# Patient Record
Sex: Female | Born: 1985 | Race: White | Hispanic: No | Smoking: Current every day smoker
Health system: Southern US, Community
[De-identification: ages and names within clinical notes are randomized; demographics above are authoritative.]

## PROBLEM LIST (undated history)

## (undated) DIAGNOSIS — F419 Anxiety disorder, unspecified: Secondary | ICD-10-CM

## (undated) DIAGNOSIS — F99 Mental disorder, not otherwise specified: Secondary | ICD-10-CM

## (undated) DIAGNOSIS — F32A Depression, unspecified: Secondary | ICD-10-CM

## (undated) DIAGNOSIS — F329 Major depressive disorder, single episode, unspecified: Secondary | ICD-10-CM

## (undated) DIAGNOSIS — K759 Inflammatory liver disease, unspecified: Secondary | ICD-10-CM

## (undated) DIAGNOSIS — Z8619 Personal history of other infectious and parasitic diseases: Secondary | ICD-10-CM

## (undated) HISTORY — PX: WISDOM TOOTH EXTRACTION: SHX21

## (undated) HISTORY — DX: Inflammatory liver disease, unspecified: K75.9

## (undated) HISTORY — DX: Personal history of other infectious and parasitic diseases: Z86.19

---
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
## 2007-02-01 ENCOUNTER — Emergency Department (HOSPITAL_COMMUNITY): Admission: EM | Admit: 2007-02-01 | Discharge: 2007-02-01 | Payer: Self-pay | Admitting: Family Medicine

## 2008-05-02 ENCOUNTER — Emergency Department (HOSPITAL_COMMUNITY): Admission: EM | Admit: 2008-05-02 | Discharge: 2008-05-02 | Payer: Self-pay | Admitting: Emergency Medicine

## 2008-10-04 ENCOUNTER — Inpatient Hospital Stay (HOSPITAL_COMMUNITY): Admission: AD | Admit: 2008-10-04 | Discharge: 2008-10-04 | Payer: Self-pay | Admitting: Obstetrics & Gynecology

## 2008-10-23 ENCOUNTER — Encounter: Payer: Self-pay | Admitting: Obstetrics and Gynecology

## 2008-10-23 ENCOUNTER — Ambulatory Visit: Payer: Self-pay | Admitting: Obstetrics & Gynecology

## 2008-10-23 LAB — CONVERTED CEMR LAB
Eosinophils Absolute: 0.3 10*3/uL (ref 0.0–0.7)
HCT: 33.6 % — ABNORMAL LOW (ref 36.0–46.0)
Lymphs Abs: 2.8 10*3/uL (ref 0.7–4.0)
MCV: 100 fL (ref 78.0–100.0)
Monocytes Relative: 10 % (ref 3–12)
Neutrophils Relative %: 75 % (ref 43–77)
RBC: 3.36 M/uL — ABNORMAL LOW (ref 3.87–5.11)
Rubella: 140.6 intl units/mL — ABNORMAL HIGH
WBC: 21.2 10*3/uL — ABNORMAL HIGH (ref 4.0–10.5)

## 2008-10-31 ENCOUNTER — Ambulatory Visit (HOSPITAL_COMMUNITY): Admission: RE | Admit: 2008-10-31 | Discharge: 2008-10-31 | Payer: Self-pay | Admitting: Family Medicine

## 2008-10-31 ENCOUNTER — Ambulatory Visit: Payer: Self-pay | Admitting: Family Medicine

## 2008-11-04 ENCOUNTER — Ambulatory Visit: Payer: Self-pay | Admitting: Obstetrics & Gynecology

## 2008-11-07 ENCOUNTER — Encounter: Payer: Self-pay | Admitting: Obstetrics and Gynecology

## 2008-11-07 ENCOUNTER — Ambulatory Visit: Payer: Self-pay | Admitting: Family Medicine

## 2008-11-07 LAB — CONVERTED CEMR LAB
ALT: 100 units/L — ABNORMAL HIGH (ref 0–35)
AST: 150 units/L — ABNORMAL HIGH (ref 0–37)
Alkaline Phosphatase: 200 units/L — ABNORMAL HIGH (ref 39–117)
Bilirubin, Direct: 0.1 mg/dL (ref 0.0–0.3)
Indirect Bilirubin: 0.1 mg/dL (ref 0.0–0.9)

## 2008-11-08 ENCOUNTER — Encounter: Payer: Self-pay | Admitting: Obstetrics and Gynecology

## 2008-11-11 ENCOUNTER — Ambulatory Visit: Payer: Self-pay | Admitting: Obstetrics & Gynecology

## 2008-11-14 ENCOUNTER — Ambulatory Visit: Payer: Self-pay | Admitting: Family Medicine

## 2008-11-18 ENCOUNTER — Ambulatory Visit: Payer: Self-pay | Admitting: Obstetrics & Gynecology

## 2008-11-21 ENCOUNTER — Encounter: Payer: Self-pay | Admitting: Obstetrics and Gynecology

## 2008-11-21 ENCOUNTER — Ambulatory Visit: Payer: Self-pay | Admitting: Family Medicine

## 2008-11-21 LAB — CONVERTED CEMR LAB
ALT: 96 units/L — ABNORMAL HIGH (ref 0–35)
Alkaline Phosphatase: 234 units/L — ABNORMAL HIGH (ref 39–117)
MCHC: 33.6 g/dL (ref 30.0–36.0)
MCV: 97.9 fL (ref 78.0–100.0)
Platelets: 388 10*3/uL (ref 150–400)
RDW: 15.8 % — ABNORMAL HIGH (ref 11.5–15.5)
Sodium: 137 meq/L (ref 135–145)
Total Bilirubin: 0.3 mg/dL (ref 0.3–1.2)
Total Protein: 6.6 g/dL (ref 6.0–8.3)

## 2008-11-22 ENCOUNTER — Inpatient Hospital Stay (HOSPITAL_COMMUNITY): Admission: AD | Admit: 2008-11-22 | Discharge: 2008-11-24 | Payer: Self-pay | Admitting: Obstetrics & Gynecology

## 2008-11-22 ENCOUNTER — Ambulatory Visit: Payer: Self-pay | Admitting: Family Medicine

## 2008-12-04 ENCOUNTER — Ambulatory Visit: Payer: Self-pay | Admitting: Advanced Practice Midwife

## 2008-12-04 ENCOUNTER — Inpatient Hospital Stay (HOSPITAL_COMMUNITY): Admission: AD | Admit: 2008-12-04 | Discharge: 2008-12-04 | Payer: Self-pay | Admitting: Obstetrics and Gynecology

## 2009-06-19 ENCOUNTER — Emergency Department (HOSPITAL_COMMUNITY): Admission: EM | Admit: 2009-06-19 | Discharge: 2009-06-19 | Payer: Self-pay | Admitting: Emergency Medicine

## 2009-09-16 ENCOUNTER — Ambulatory Visit (HOSPITAL_COMMUNITY): Admission: RE | Admit: 2009-09-16 | Discharge: 2009-09-16 | Payer: Self-pay | Admitting: Obstetrics

## 2009-11-30 ENCOUNTER — Inpatient Hospital Stay (HOSPITAL_COMMUNITY): Admission: AD | Admit: 2009-11-30 | Discharge: 2009-12-02 | Payer: Self-pay | Admitting: Obstetrics

## 2009-12-03 ENCOUNTER — Inpatient Hospital Stay (HOSPITAL_COMMUNITY): Admission: AD | Admit: 2009-12-03 | Discharge: 2009-12-05 | Payer: Self-pay | Admitting: Obstetrics

## 2010-09-02 ENCOUNTER — Emergency Department (HOSPITAL_COMMUNITY)
Admission: EM | Admit: 2010-09-02 | Discharge: 2010-09-02 | Disposition: A | Discharge status: Home / Self Care | Payer: Self-pay | Attending: Emergency Medicine | Admitting: Emergency Medicine

## 2010-09-02 DIAGNOSIS — N898 Other specified noninflammatory disorders of vagina: Secondary | ICD-10-CM | POA: Insufficient documentation

## 2010-09-02 DIAGNOSIS — R1032 Left lower quadrant pain: Secondary | ICD-10-CM | POA: Insufficient documentation

## 2010-09-02 DIAGNOSIS — R110 Nausea: Secondary | ICD-10-CM | POA: Insufficient documentation

## 2010-09-02 DIAGNOSIS — B3731 Acute candidiasis of vulva and vagina: Secondary | ICD-10-CM | POA: Insufficient documentation

## 2010-09-02 DIAGNOSIS — B373 Candidiasis of vulva and vagina: Secondary | ICD-10-CM | POA: Insufficient documentation

## 2010-09-02 DIAGNOSIS — K5900 Constipation, unspecified: Secondary | ICD-10-CM | POA: Insufficient documentation

## 2010-09-02 LAB — URINALYSIS, ROUTINE W REFLEX MICROSCOPIC
Bilirubin Urine: NEGATIVE
Ketones, ur: NEGATIVE mg/dL
Specific Gravity, Urine: 1.011 (ref 1.005–1.030)
Urine Glucose, Fasting: NEGATIVE mg/dL
pH: 6.5 (ref 5.0–8.0)

## 2010-09-02 LAB — WET PREP, GENITAL
Clue Cells Wet Prep HPF POC: NONE SEEN
Trich, Wet Prep: NONE SEEN

## 2010-09-03 LAB — GC/CHLAMYDIA PROBE AMP, GENITAL: Chlamydia, DNA Probe: NEGATIVE

## 2010-09-21 LAB — DIFFERENTIAL
Basophils Absolute: 0.1 10*3/uL (ref 0.0–0.1)
Basophils Relative: 0 % (ref 0–1)
Lymphocytes Relative: 10 % — ABNORMAL LOW (ref 12–46)
Monocytes Absolute: 1.2 10*3/uL — ABNORMAL HIGH (ref 0.1–1.0)
Neutro Abs: 13.3 10*3/uL — ABNORMAL HIGH (ref 1.7–7.7)
Neutrophils Relative %: 79 % — ABNORMAL HIGH (ref 43–77)

## 2010-09-21 LAB — RAPID URINE DRUG SCREEN, HOSP PERFORMED
Amphetamines: NOT DETECTED
Barbiturates: NOT DETECTED
Cocaine: POSITIVE — AB
Opiates: NOT DETECTED
Tetrahydrocannabinol: NOT DETECTED

## 2010-09-21 LAB — CBC
HCT: 30.4 % — ABNORMAL LOW (ref 36.0–46.0)
Hemoglobin: 9 g/dL — ABNORMAL LOW (ref 12.0–15.0)
Hemoglobin: 10.5 g/dL — ABNORMAL LOW (ref 12.0–15.0)
Hemoglobin: 9.4 g/dL — ABNORMAL LOW (ref 12.0–15.0)
MCHC: 34.2 g/dL (ref 30.0–36.0)
MCHC: 35.1 g/dL (ref 30.0–36.0)
MCV: 88.9 fL (ref 78.0–100.0)
Platelets: 295 10*3/uL (ref 150–400)
Platelets: 298 10*3/uL (ref 150–400)
Platelets: 326 10*3/uL (ref 150–400)
RBC: 3.46 MIL/uL — ABNORMAL LOW (ref 3.87–5.11)
RDW: 16.1 % — ABNORMAL HIGH (ref 11.5–15.5)
RDW: 16.3 % — ABNORMAL HIGH (ref 11.5–15.5)
RDW: 16 % — ABNORMAL HIGH (ref 11.5–15.5)
WBC: 28.1 10*3/uL — ABNORMAL HIGH (ref 4.0–10.5)
WBC: 15.5 10*3/uL — ABNORMAL HIGH (ref 4.0–10.5)

## 2010-09-21 LAB — URINALYSIS, ROUTINE W REFLEX MICROSCOPIC
Ketones, ur: NEGATIVE mg/dL
Nitrite: NEGATIVE
Urobilinogen, UA: 0.2 mg/dL (ref 0.0–1.0)
pH: 7 (ref 5.0–8.0)

## 2010-10-05 LAB — URINALYSIS, ROUTINE W REFLEX MICROSCOPIC
Bilirubin Urine: NEGATIVE
Hgb urine dipstick: NEGATIVE
Nitrite: NEGATIVE
Protein, ur: NEGATIVE mg/dL
Specific Gravity, Urine: 1.028 (ref 1.005–1.030)
Urobilinogen, UA: 0.2 mg/dL (ref 0.0–1.0)

## 2010-10-05 LAB — URINE MICROSCOPIC-ADD ON

## 2010-10-05 LAB — POCT PREGNANCY, URINE: Preg Test, Ur: POSITIVE

## 2010-10-13 LAB — CBC
HCT: 37.2 % (ref 36.0–46.0)
Hemoglobin: 13.1 g/dL (ref 12.0–15.0)
MCHC: 35.6 g/dL (ref 30.0–36.0)
MCV: 97 fL (ref 78.0–100.0)
Platelets: 237 10*3/uL (ref 150–400)
RBC: 3.83 MIL/uL — ABNORMAL LOW (ref 3.87–5.11)
RDW: 15.5 % (ref 11.5–15.5)

## 2010-10-13 LAB — POCT URINALYSIS DIP (DEVICE)
Bilirubin Urine: NEGATIVE
Bilirubin Urine: NEGATIVE
Glucose, UA: NEGATIVE mg/dL
Glucose, UA: NEGATIVE mg/dL
Hgb urine dipstick: NEGATIVE
Ketones, ur: NEGATIVE mg/dL
Nitrite: NEGATIVE
Nitrite: NEGATIVE
Nitrite: NEGATIVE
Urobilinogen, UA: 0.2 mg/dL (ref 0.0–1.0)
pH: 7 (ref 5.0–8.0)

## 2010-10-13 LAB — RAPID URINE DRUG SCREEN, HOSP PERFORMED
Amphetamines: NOT DETECTED
Barbiturates: NOT DETECTED
Benzodiazepines: NOT DETECTED
Cocaine: POSITIVE — AB
Opiates: NOT DETECTED

## 2010-10-13 LAB — GLUCOSE, RANDOM: Glucose, Bld: 82 mg/dL (ref 70–99)

## 2010-10-14 LAB — COMPREHENSIVE METABOLIC PANEL
ALT: 85 U/L — ABNORMAL HIGH (ref 0–35)
AST: 121 U/L — ABNORMAL HIGH (ref 0–37)
Albumin: 2.5 g/dL — ABNORMAL LOW (ref 3.5–5.2)
CO2: 24 mEq/L (ref 19–32)
Calcium: 8.8 mg/dL (ref 8.4–10.5)
GFR calc Af Amer: >60 mL/min (ref >60)
Sodium: 137 mEq/L (ref 135–145)
Total Protein: 5.4 g/dL — ABNORMAL LOW (ref 6.0–8.3)

## 2010-10-14 LAB — RAPID URINE DRUG SCREEN, HOSP PERFORMED
Amphetamines: NOT DETECTED
Cocaine: POSITIVE — AB
Tetrahydrocannabinol: POSITIVE — AB

## 2010-10-14 LAB — STREP B DNA PROBE

## 2010-10-14 LAB — GC/CHLAMYDIA PROBE AMP, URINE: GC Probe Amp, Urine: POSITIVE — AB

## 2010-10-14 LAB — POCT URINALYSIS DIP (DEVICE)
Bilirubin Urine: NEGATIVE
Hgb urine dipstick: NEGATIVE
Hgb urine dipstick: NEGATIVE
Nitrite: NEGATIVE
Protein, ur: NEGATIVE mg/dL
Specific Gravity, Urine: 1.015 (ref 1.005–1.030)
Urobilinogen, UA: 0.2 mg/dL (ref 0.0–1.0)
pH: 6.5 (ref 5.0–8.0)

## 2010-10-14 LAB — URINE MICROSCOPIC-ADD ON

## 2010-10-14 LAB — URINALYSIS, ROUTINE W REFLEX MICROSCOPIC
Bilirubin Urine: NEGATIVE
Glucose, UA: NEGATIVE mg/dL
Hgb urine dipstick: NEGATIVE
Ketones, ur: NEGATIVE mg/dL
pH: 6 (ref 5.0–8.0)

## 2010-10-14 LAB — CBC
MCHC: 35 g/dL (ref 30.0–36.0)
MCV: 98.8 fL (ref 78.0–100.0)
Platelets: 358 10*3/uL (ref 150–400)
RDW: 14.6 % (ref 11.5–15.5)
WBC: 16.1 10*3/uL — ABNORMAL HIGH (ref 4.0–10.5)

## 2010-10-14 LAB — PROTIME-INR: INR: 0.9 (ref 0.00–1.49)
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
## 2011-04-06 LAB — URINALYSIS, ROUTINE W REFLEX MICROSCOPIC
Bilirubin Urine: NEGATIVE
Glucose, UA: NEGATIVE
Hgb urine dipstick: NEGATIVE
Ketones, ur: >80 — AB
Nitrite: NEGATIVE
Protein, ur: NEGATIVE
Specific Gravity, Urine: 1.029
Urobilinogen, UA: 0.2
pH: 6

## 2011-04-06 LAB — URINE MICROSCOPIC-ADD ON

## 2011-04-06 LAB — POCT PREGNANCY, URINE: Preg Test, Ur: POSITIVE

## 2012-04-04 ENCOUNTER — Inpatient Hospital Stay (HOSPITAL_COMMUNITY)
Admission: AD | Admit: 2012-04-04 | Discharge: 2012-04-11 | DRG: 897 | Disposition: A | Discharge status: Home / Self Care | Payer: No Typology Code available for payment source | Source: Ambulatory Visit | Attending: Psychiatry | Admitting: Psychiatry

## 2012-04-04 ENCOUNTER — Encounter (HOSPITAL_COMMUNITY): Payer: Self-pay | Admitting: Emergency Medicine

## 2012-04-04 ENCOUNTER — Encounter (HOSPITAL_COMMUNITY): Payer: Self-pay | Admitting: *Deleted

## 2012-04-04 ENCOUNTER — Emergency Department (HOSPITAL_COMMUNITY)
Admission: EM | Admit: 2012-04-04 | Discharge: 2012-04-04 | Disposition: A | Discharge status: Other Acute Inpatient Hospital | Payer: Medicaid Other | Attending: Emergency Medicine | Admitting: Emergency Medicine

## 2012-04-04 DIAGNOSIS — F329 Major depressive disorder, single episode, unspecified: Secondary | ICD-10-CM

## 2012-04-04 DIAGNOSIS — F19939 Other psychoactive substance use, unspecified with withdrawal, unspecified: Principal | ICD-10-CM | POA: Diagnosis not present

## 2012-04-04 DIAGNOSIS — Z23 Encounter for immunization: Secondary | ICD-10-CM

## 2012-04-04 DIAGNOSIS — F1210 Cannabis abuse, uncomplicated: Secondary | ICD-10-CM

## 2012-04-04 DIAGNOSIS — IMO0002 Reserved for concepts with insufficient information to code with codable children: Secondary | ICD-10-CM

## 2012-04-04 DIAGNOSIS — F1994 Other psychoactive substance use, unspecified with psychoactive substance-induced mood disorder: Secondary | ICD-10-CM | POA: Diagnosis present

## 2012-04-04 DIAGNOSIS — F1920 Other psychoactive substance dependence, uncomplicated: Secondary | ICD-10-CM | POA: Diagnosis present

## 2012-04-04 DIAGNOSIS — F17200 Nicotine dependence, unspecified, uncomplicated: Secondary | ICD-10-CM | POA: Diagnosis present

## 2012-04-04 DIAGNOSIS — Z79899 Other long term (current) drug therapy: Secondary | ICD-10-CM

## 2012-04-04 DIAGNOSIS — F411 Generalized anxiety disorder: Secondary | ICD-10-CM | POA: Diagnosis present

## 2012-04-04 DIAGNOSIS — F1420 Cocaine dependence, uncomplicated: Secondary | ICD-10-CM | POA: Insufficient documentation

## 2012-04-04 DIAGNOSIS — F1410 Cocaine abuse, uncomplicated: Secondary | ICD-10-CM

## 2012-04-04 DIAGNOSIS — F39 Unspecified mood [affective] disorder: Secondary | ICD-10-CM | POA: Diagnosis present

## 2012-04-04 HISTORY — DX: Major depressive disorder, single episode, unspecified: F32.9

## 2012-04-04 HISTORY — DX: Depression, unspecified: F32.A

## 2012-04-04 HISTORY — DX: Anxiety disorder, unspecified: F41.9

## 2012-04-04 HISTORY — DX: Mental disorder, not otherwise specified: F99

## 2012-04-04 LAB — CBC WITH DIFFERENTIAL/PLATELET
Basophils Absolute: 0.1 10*3/uL (ref 0.0–0.1)
Basophils Relative: 1 % (ref 0–1)
Eosinophils Absolute: 0.2 10*3/uL (ref 0.0–0.7)
Hemoglobin: 13.1 g/dL (ref 12.0–15.0)
MCH: 32.3 pg (ref 26.0–34.0)
MCHC: 33.6 g/dL (ref 30.0–36.0)
Monocytes Relative: 9 % (ref 3–12)
Neutro Abs: 5.8 10*3/uL (ref 1.7–7.7)
Neutrophils Relative %: 61 % (ref 43–77)
Platelets: 365 10*3/uL (ref 150–400)

## 2012-04-04 LAB — RAPID URINE DRUG SCREEN, HOSP PERFORMED
Amphetamines: NOT DETECTED
Barbiturates: NOT DETECTED
Benzodiazepines: POSITIVE — AB
Cocaine: POSITIVE — AB
Opiates: NOT DETECTED
Tetrahydrocannabinol: POSITIVE — AB

## 2012-04-04 LAB — COMPREHENSIVE METABOLIC PANEL
ALT: 7 U/L (ref 0–35)
AST: 14 U/L (ref 0–37)
Albumin: 4.2 g/dL (ref 3.5–5.2)
Alkaline Phosphatase: 86 U/L (ref 39–117)
Chloride: 104 mEq/L (ref 96–112)
Potassium: 3.7 mEq/L (ref 3.5–5.1)
Sodium: 140 mEq/L (ref 135–145)
Total Bilirubin: 0.3 mg/dL (ref 0.3–1.2)
Total Protein: 7.4 g/dL (ref 6.0–8.3)

## 2012-04-04 LAB — POCT PREGNANCY, URINE: Preg Test, Ur: NEGATIVE

## 2012-04-04 MED ORDER — ACETAMINOPHEN 325 MG PO TABS: 650.0000 mg | ORAL_TABLET | ORAL | Status: DC | PRN

## 2012-04-04 MED ORDER — HYDROXYZINE HCL 25 MG PO TABS
25.0000 mg | ORAL_TABLET | Freq: Four times a day (QID) | ORAL | Status: AC | PRN
  Administered 2012-04-05 – 2012-04-06 (×3): 25 mg via ORAL

## 2012-04-04 MED ORDER — TRAZODONE HCL 50 MG PO TABS
50.0000 mg | ORAL_TABLET | Freq: Every evening | ORAL | Status: DC | PRN
  Administered 2012-04-04 – 2012-04-07 (×4): 50 mg via ORAL
  Filled 2012-04-04: qty 1
  Filled 2012-04-04: qty 14
  Filled 2012-04-04 (×3): qty 1

## 2012-04-04 MED ORDER — CHLORDIAZEPOXIDE HCL 25 MG PO CAPS: 25.0000 mg | ORAL_CAPSULE | Freq: Four times a day (QID) | ORAL | Status: DC | PRN

## 2012-04-04 MED ORDER — THIAMINE HCL 100 MG/ML IJ SOLN: 100.0000 mg | Freq: Once | INTRAMUSCULAR | Status: DC

## 2012-04-04 MED ORDER — HYDROXYZINE HCL 25 MG PO TABS: 25.0000 mg | ORAL_TABLET | Freq: Four times a day (QID) | ORAL | Status: DC | PRN

## 2012-04-04 MED ORDER — NICOTINE 21 MG/24HR TD PT24
21.0000 mg | MEDICATED_PATCH | Freq: Every day | TRANSDERMAL | Status: DC
  Administered 2012-04-04: 21 mg via TRANSDERMAL
  Filled 2012-04-04: qty 1

## 2012-04-04 MED ORDER — MAGNESIUM HYDROXIDE 400 MG/5ML PO SUSP
30.0000 mL | Freq: Every day | ORAL | Status: DC | PRN
  Administered 2012-04-06 – 2012-04-09 (×2): 30 mL via ORAL

## 2012-04-04 MED ORDER — ONDANSETRON 4 MG PO TBDP: 4.0000 mg | ORAL_TABLET | Freq: Four times a day (QID) | ORAL | Status: AC | PRN

## 2012-04-04 MED ORDER — LORAZEPAM 1 MG PO TABS
1.0000 mg | ORAL_TABLET | Freq: Three times a day (TID) | ORAL | Status: DC | PRN
  Administered 2012-04-04: 1 mg via ORAL
  Filled 2012-04-04: qty 1

## 2012-04-04 MED ORDER — VITAMIN B-1 100 MG PO TABS: 100.0000 mg | ORAL_TABLET | Freq: Every day | ORAL | Status: DC

## 2012-04-04 MED ORDER — LOPERAMIDE HCL 2 MG PO CAPS: 2.0000 mg | ORAL_CAPSULE | ORAL | Status: DC | PRN

## 2012-04-04 MED ORDER — THIAMINE HCL 100 MG/ML IJ SOLN
100.0000 mg | Freq: Once | INTRAMUSCULAR | Status: AC
  Administered 2012-04-04: 100 mg via INTRAMUSCULAR

## 2012-04-04 MED ORDER — ACETAMINOPHEN 325 MG PO TABS: 650.0000 mg | ORAL_TABLET | Freq: Four times a day (QID) | ORAL | Status: DC | PRN

## 2012-04-04 MED ORDER — ADULT MULTIVITAMIN W/MINERALS CH
1.0000 | ORAL_TABLET | Freq: Every day | ORAL | Status: DC
  Administered 2012-04-04 – 2012-04-10 (×7): 1 via ORAL
  Filled 2012-04-04 (×9): qty 1

## 2012-04-04 MED ORDER — CHLORDIAZEPOXIDE HCL 25 MG PO CAPS
25.0000 mg | ORAL_CAPSULE | Freq: Three times a day (TID) | ORAL | Status: AC
  Administered 2012-04-06 – 2012-04-07 (×3): 25 mg via ORAL
  Filled 2012-04-04 (×3): qty 1

## 2012-04-04 MED ORDER — CHLORDIAZEPOXIDE HCL 25 MG PO CAPS
25.0000 mg | ORAL_CAPSULE | Freq: Four times a day (QID) | ORAL | Status: AC | PRN
  Administered 2012-04-04 – 2012-04-07 (×3): 25 mg via ORAL
  Filled 2012-04-04 (×3): qty 1

## 2012-04-04 MED ORDER — ONDANSETRON 4 MG PO TBDP: 4.0000 mg | ORAL_TABLET | Freq: Four times a day (QID) | ORAL | Status: DC | PRN

## 2012-04-04 MED ORDER — CHLORDIAZEPOXIDE HCL 25 MG PO CAPS
25.0000 mg | ORAL_CAPSULE | Freq: Four times a day (QID) | ORAL | Status: AC
  Administered 2012-04-04 – 2012-04-06 (×6): 25 mg via ORAL
  Filled 2012-04-04 (×6): qty 1

## 2012-04-04 MED ORDER — VITAMIN B-1 100 MG PO TABS
100.0000 mg | ORAL_TABLET | Freq: Every day | ORAL | Status: DC
  Filled 2012-04-04: qty 1

## 2012-04-04 MED ORDER — CHLORDIAZEPOXIDE HCL 25 MG PO CAPS
25.0000 mg | ORAL_CAPSULE | ORAL | Status: AC
  Administered 2012-04-07 – 2012-04-08 (×2): 25 mg via ORAL
  Filled 2012-04-04 (×2): qty 1

## 2012-04-04 MED ORDER — LOPERAMIDE HCL 2 MG PO CAPS: 2.0000 mg | ORAL_CAPSULE | ORAL | Status: AC | PRN

## 2012-04-04 MED ORDER — CHLORDIAZEPOXIDE HCL 25 MG PO CAPS
25.0000 mg | ORAL_CAPSULE | Freq: Every day | ORAL | Status: AC
  Administered 2012-04-09: 25 mg via ORAL
  Filled 2012-04-04: qty 1

## 2012-04-04 MED ORDER — IBUPROFEN 200 MG PO TABS: 600.0000 mg | ORAL_TABLET | Freq: Three times a day (TID) | ORAL | Status: DC | PRN

## 2012-04-04 MED ORDER — ONDANSETRON HCL 8 MG PO TABS: 4.0000 mg | ORAL_TABLET | Freq: Three times a day (TID) | ORAL | Status: DC | PRN

## 2012-04-04 MED ORDER — ALUM & MAG HYDROXIDE-SIMETH 200-200-20 MG/5ML PO SUSP
30.0000 mL | ORAL | Status: DC | PRN
  Administered 2012-04-07 – 2012-04-09 (×2): 30 mL via ORAL

## 2012-04-04 MED ORDER — ADULT MULTIVITAMIN W/MINERALS CH: 1.0000 | ORAL_TABLET | Freq: Every day | ORAL | Status: DC

## 2012-04-04 NOTE — ED Notes (Addendum)
Pt sent to ED by detox facility for medical clearance. Pt reports she was to check into treatment facility for detox from crack/cocaine but because she takes Klonopin she was sent to ED. Last use of crack/cocaine was 0200 today.
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
## 2012-04-04 NOTE — ED Provider Notes (Signed)
History     CSN: [id number]  Arrival date & time 04/04/12  4098   First MD Initiated Contact with Patient 04/04/12 [phone]      Chief Complaint  Patient presents with  . Medical Clearance    (Consider location/radiation/quality/duration/timing/severity/associated sxs/prior treatment) The history is provided by the patient.   Patient here requesting detox from cocaine. Denies any suicidal or homicidal ideations. Went to a treatment center and was told to come here for placement. Denies any auditory or visual hallucinations. Last used cocaine yesterday. Hasn't taken her Klonopin as prescribed History reviewed. No pertinent past medical history.  History reviewed. No pertinent past surgical history.  No family history on file.  History  Substance Use Topics  . Smoking status: Not on file  . Smokeless tobacco: Not on file  . Alcohol Use: Not on file    OB History    Grav Para Term Preterm Abortions TAB SAB Ect Mult Living                  Review of Systems  All other systems reviewed and are negative.    Allergies  Keflet  Home Medications   Current Outpatient Rx  Name Route Sig Dispense Refill  . CLONAZEPAM 1 MG PO TABS Oral Take 1 mg by mouth 2 (two) times daily.    . TRAZODONE HCL 50 MG PO TABS Oral Take 50-100 mg by mouth at bedtime as needed. For sleep      BP 96/67  Pulse 92  Temp 97.3 F (36.3 C) (Oral)  Resp 16  Ht 5\' 4"  (1.626 m)  Wt 130 lb (58.968 kg)  BMI 22.31 kg/m2  SpO2 97%  Physical Exam  Nursing note and vitals reviewed. Constitutional: She is oriented to person, place, and time. She appears well-developed and well-nourished.  Non-toxic appearance. No distress.  HENT:  Head: Normocephalic and atraumatic.  Eyes: Conjunctivae normal, EOM and lids are normal. Pupils are equal, round, and reactive to light.  Neck: Normal range of motion. Neck supple. No tracheal deviation present. No mass present.  Cardiovascular: Normal rate, regular rhythm  and normal heart sounds.  Exam reveals no gallop.   No murmur heard. Pulmonary/Chest: Effort normal and breath sounds normal. No stridor. No respiratory distress. She has no decreased breath sounds. She has no wheezes. She has no rhonchi. She has no rales.  Abdominal: Soft. Normal appearance and bowel sounds are normal. She exhibits no distension. There is no tenderness. There is no rebound and no CVA tenderness.  Musculoskeletal: Normal range of motion. She exhibits no edema and no tenderness.  Neurological: She is alert and oriented to person, place, and time. She has normal strength. No cranial nerve deficit or sensory deficit. GCS eye subscore is 4. GCS verbal subscore is 5. GCS motor subscore is 6.  Skin: Skin is warm and dry. No abrasion and no rash noted.  Psychiatric: Her speech is normal and behavior is normal. Her affect is blunt.    ED Course  Procedures (including critical care time)   Labs Reviewed  ETHANOL  URINE RAPID DRUG SCREEN (HOSP PERFORMED)  CBC WITH DIFFERENTIAL  COMPREHENSIVE METABOLIC PANEL   No results found.   No diagnosis found.    MDM  Pt medically cleared and act to dispo        Toy Baker, MD 04/07/12 [phone]

## 2012-04-04 NOTE — BH Assessment (Signed)
Assessment Note  Update:  Received call from [facility] stating pt accepted to bed 305-2 by Dr. Allena Katz to Dr. Daleen Bo and that pt could be transported.  Updated EDP Bonk and ED staff.  Completed support paperwork, updated assessment disposition, completed assessment notification and faxed to [facility] to log.     Disposition:  Disposition Disposition of Patient: Inpatient treatment program Type of inpatient treatment program: Adult Patient referred to: Other (Comment) (Pt accepted [facility])  On Site Evaluation by:  Freida Busman Reviewed with Physician:  Earley Brooke, Rennis Harding 04/04/2012 2:07 PM
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
## 2012-04-04 NOTE — Tx Team (Signed)
Initial Interdisciplinary Treatment Plan  PATIENT STRENGTHS: (choose at least two) Communication skills General fund of knowledge Motivation for treatment/growth  PATIENT STRESSORS: Financial difficulties Health problems Medication change or noncompliance Occupational concerns Substance abuse   PROBLEM LIST: Problem List/Patient Goals Date to be addressed Date deferred Reason deferred Estimated date of resolution  Substance abuse 04-04-2012   D/c        depression 04-04-2012   D/c        anxiety 04-04-2012   D/c                           DISCHARGE CRITERIA:  Ability to meet basic life and health needs Adequate post-discharge living arrangements Improved stabilization in mood, thinking, and/or behavior Medical problems require only outpatient monitoring Motivation to continue treatment in a less acute level of care Need for constant or close observation no longer present Reduction of life-threatening or endangering symptoms to within safe limits Safe-care adequate arrangements made Verbal commitment to aftercare and medication compliance Withdrawal symptoms are absent or subacute and managed without 24-hour nursing intervention  PRELIMINARY DISCHARGE PLAN: Attend aftercare/continuing care group Attend PHP/IOP Attend 12-step recovery group Outpatient therapy Participate in family therapy Placement in alternative living arrangements  PATIENT/FAMIILY INVOLVEMENT: This treatment plan has been presented to and reviewed with the patient, Karen Salas.  The patient and family have been given the opportunity to ask questions and make suggestions.  [name] 04/04/2012, 5:31 PM

## 2012-04-04 NOTE — ED Notes (Signed)
[facility] paged to transport patient to [facility].

## 2012-04-04 NOTE — ED Notes (Signed)
Pt given scrubs and socks to pt on; pt changing out of street clothes; security has been paged to wand pt

## 2012-04-04 NOTE — ED Notes (Signed)
Patient wanded by Security and dressed in blue scrubs. Patient given snacks and drink. Lunch tray ordered.

## 2012-04-04 NOTE — ED Notes (Signed)
Assisted Pat, RN with inventory of pt's belongings, security locked up pt's valuables

## 2012-04-04 NOTE — ED Notes (Addendum)
Patient states last used crack at 0200 this date and denies other drug use. Patient states she has tried outpatient treatment and it did not work for her. Patient states she is feeling shaky and hungry. Patient states she has a 24 year old daughter and husband at home.

## 2012-04-05 MED ORDER — NICOTINE 21 MG/24HR TD PT24
21.0000 mg | MEDICATED_PATCH | Freq: Every day | TRANSDERMAL | Status: DC
  Administered 2012-04-05 – 2012-04-11 (×6): 21 mg via TRANSDERMAL
  Filled 2012-04-05 (×7): qty 1

## 2012-04-05 NOTE — Progress Notes (Signed)
Pt resting in bed with eyes closed.  No distress observed.  Safety maintained with q15 minute checks. 

## 2012-04-05 NOTE — Progress Notes (Signed)
Psychoeducational Group Note  Date:  04/05/2012 Time:  1100  Group Topic/Focus:  Crisis Planning:   The purpose of this group is to help patients create a crisis plan for use upon discharge or in the future, as needed.  Participation Level:  Active  Participation Quality:  Appropriate, Sharing and Supportive  Affect:  Appropriate  Cognitive:  Appropriate  Insight:  Good  Engagement in Group:  Good  Additional Comments:  none  Jcion Buddenhagen, Genia Plants 04/05/2012, 2:08 PM

## 2012-04-05 NOTE — Progress Notes (Signed)
D- Patient has been out in milieu interacting with peers and attending groups. Reports agitation and cravings r/t benzo withdrawal. Rates depression at 2 and hopelessness at 1. Karen Salas states this is her first inpatient detox. Presents fidgety but appropriate.  A- Support and encouragement given.  Explanation on what to expect from withdrawal process.  Continue current POC and evaluation of treatment goals. 15' checks cont for safety. R- Compliant with medication protocol and education given. Introduction to 12 step concepts.  Support and encouragement given.  Remains safe on the unit.

## 2012-04-05 NOTE — Progress Notes (Signed)
[facility] Group Notes:  (Counselor/Nursing/MHT/Case Management/Adjunct)   Type of Therapy:  Group Therapy at 1:15 to 2:30 PM   Participation Level:  Active  Participation Quality:  Redirectable, Sharing and easily distracted  Affect:  Labile  Cognitive:  Alert and Oriented  Insight:  Limited  Engagement in Group:  Good  Engagement in Therapy:  Limited  Modes of Intervention:  Limit-setting, Socialization and Support  Summary of Progress/Problems: The focus of this group session was to process how we deal with difficult emotions and share with others the patterns that play out when we are reacting to the emotion verses the situation.  Karen Salas shared that one of the most difficult emotions she deals with is anxiety but was unwilling to explore ways she may change that.  Karen Salas was responsive to multiple instances of redirection.  Karen Salas 04/05/2012, 7:17 PM
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
## 2012-04-05 NOTE — Progress Notes (Signed)
Pt attended NA group tonight. 

## 2012-04-05 NOTE — Progress Notes (Signed)
Pt reports she is doing better since she was started on some medication.  She still has visible tremors and is somewhat fidgety, but overall feels better.  She was able to see one of her children tonight, and that made her "happy and sad".  She says she is determined to get off of drugs for her health and for her children.  "I want to be a good mother."  Gave support/encouragement.  She denies SI/HI/AV.  Discussed hs meds with pt.  Pt requests HIV testing as she thinks she has been exposed.  Will contact MD for orders and get signed consent.  Pt voices no other needs/concerns.  Safety maintained with q15 minute checks.
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
## 2012-04-05 NOTE — Discharge Planning (Signed)
Pt was seen this morning during discharge meeting.  Pt stated that she admitted to the hospital in order to detox from "crack," she expressed that she was using up to $1000 per day on the weekends. Pt stated that she has had many prior services that have not been successful in the past.  Pt want to go to [facility] for residential treatment.  Upon discharge pt will return home to live with boyfriend and her daughter.  Pt currently has an open DSS case.  Pt rated her depression level at zero, anxiety at a ten, and she denied SI/HI/AVH.  CM observed pt unable to sit still and shaking.

## 2012-04-06 MED ORDER — QUETIAPINE FUMARATE 50 MG PO TABS
50.0000 mg | ORAL_TABLET | Freq: Two times a day (BID) | ORAL | Status: DC
  Administered 2012-04-06 – 2012-04-08 (×5): 50 mg via ORAL
  Filled 2012-04-06 (×11): qty 1

## 2012-04-06 NOTE — Progress Notes (Signed)
[facility] Group Notes:  (Counselor/Nursing/MHT/Case Management/Adjunct)  04/06/2012 4:14 PM  Type of Therapy:  Group Therapy  Participation Level:  Active  Participation Quality:  Appropriate  Affect:  Appropriate  Cognitive:  Alert  Insight:  Good  Engagement in Group:  Good  Engagement in Therapy:  Good  Modes of Intervention:  Education, Support  Summary of Progress/Problems:  The process group today was regarding the ability of each pt to create the appropriate balance within there life in order to maintain sobriety and healthy living. The pt was able to process her personal thoughts and feelings regarding what it would take for her to create balance within her life.      Karen Salas [name] 04/06/2012, 4:14 PM

## 2012-04-06 NOTE — Progress Notes (Signed)
[facility] MD Progress Note  04/06/2012 9:01 AM S: Patient reports feeling sad yesterday. Wants to go to [facility] for treatment. Patient reports high irritability all the time prior to starting to abusing cocaine.  Diagnosis:   Axis I: Cocaine Abuse, Marijuana Abuse, Mood d/o NOS Axis II: No diagnosis Axis III:  Past Medical History  Diagnosis Date  . Anxiety   . Mental disorder   . Depression    Axis IV: economic problems, occupational problems and other psychosocial or environmental problems Axis V: 51-60 moderate symptoms  ADL's:  Intact  Sleep: Fair  Appetite:  Fair    Mental Status Examination/Evaluation: Objective:  Appearance: Casual  Eye Contact::  Fair  Speech:  Clear and Coherent  Volume:  Normal  Mood:  Anxious  Affect:  Constricted  Thought Process:  Coherent  Orientation:  Full  Thought Content:  WDL  Suicidal Thoughts:  No  Homicidal Thoughts:  No  Memory:  Immediate;   Fair Recent;   Fair Remote;   Fair  Judgement:  Fair  Insight:  Fair  Psychomotor Activity:  Normal  Concentration:  Fair  Recall:  Fair  Akathisia:  No  Handed:  Right  AIMS (if indicated):     Assets:  Communication Skills Desire for Improvement  Sleep:  Number of Hours: 6    Vital Signs:Blood pressure 108/71, pulse 111, temperature 98.2 F (36.8 C), temperature source Oral, resp. rate 14, height 5\' 5"  (1.651 m), weight 57.153 kg (126 lb), last menstrual period 03/29/2012. Current Medications: Current Facility-Administered Medications  Medication Dose Route Frequency Provider Last Rate Last Dose  . acetaminophen (TYLENOL) tablet 650 mg  650 mg Oral Q6H PRN Jorje Guild, PA-C      . alum & mag hydroxide-simeth (MAALOX/MYLANTA) 200-200-20 MG/5ML suspension 30 mL  30 mL Oral Q4H PRN Jorje Guild, PA-C      . chlordiazePOXIDE (LIBRIUM) capsule 25 mg  25 mg Oral Q6H PRN Jorje Guild, PA-C   25 mg at 04/04/12 1919  . chlordiazePOXIDE (LIBRIUM) capsule 25 mg  25 mg Oral QID Jorje Guild, PA-C   25 mg  at 04/06/12 1610   Followed by  . chlordiazePOXIDE (LIBRIUM) capsule 25 mg  25 mg Oral TID Jorje Guild, PA-C       Followed by  . chlordiazePOXIDE (LIBRIUM) capsule 25 mg  25 mg Oral BH-qamhs Jorje Guild, PA-C       Followed by  . chlordiazePOXIDE (LIBRIUM) capsule 25 mg  25 mg Oral Daily Jorje Guild, PA-C      . hydrOXYzine (ATARAX/VISTARIL) tablet 25 mg  25 mg Oral Q6H PRN Jorje Guild, PA-C   25 mg at 04/06/12 9604  . loperamide (IMODIUM) capsule 2-4 mg  2-4 mg Oral PRN Jorje Guild, PA-C      . magnesium hydroxide (MILK OF MAGNESIA) suspension 30 mL  30 mL Oral Daily PRN Jorje Guild, PA-C      . multivitamin with minerals tablet 1 tablet  1 tablet Oral Daily Jorje Guild, PA-C   1 tablet at 04/06/12 5409  . nicotine (NICODERM CQ - dosed in mg/24 hours) patch 21 mg  21 mg Transdermal Q0600 Mike Craze, MD   21 mg at 04/06/12 0617  . ondansetron (ZOFRAN-ODT) disintegrating tablet 4 mg  4 mg Oral Q6H PRN Jorje Guild, PA-C      . traZODone (DESYREL) tablet 50 mg  50 mg Oral QHS PRN Jorje Guild, PA-C   50 mg at 04/05/12 2136    Lab  Results:  Results for orders placed during the hospital encounter of 04/04/12 (from the past 48 hour(s))  ETHANOL     Status: Normal   Collection Time   04/04/12  9:25 AM      Component Value Range Comment   Alcohol, Ethyl (B) <11  0 - 11 mg/dL   URINE RAPID DRUG SCREEN (HOSP PERFORMED)     Status: Abnormal   Collection Time   04/04/12  9:45 AM      Component Value Range Comment   Opiates NONE DETECTED  NONE DETECTED    Cocaine POSITIVE (*) NONE DETECTED    Benzodiazepines POSITIVE (*) NONE DETECTED    Amphetamines NONE DETECTED  NONE DETECTED    Tetrahydrocannabinol POSITIVE (*) NONE DETECTED    Barbiturates NONE DETECTED  NONE DETECTED   POCT PREGNANCY, URINE     Status: Normal   Collection Time   04/04/12  9:52 AM      Component Value Range Comment   Preg Test, Ur NEGATIVE  NEGATIVE   CBC WITH DIFFERENTIAL     Status: Normal   Collection Time   04/04/12  9:53 AM       Component Value Range Comment   WBC 9.5  4.0 - 10.5 K/uL    RBC 4.06  3.87 - 5.11 MIL/uL    Hemoglobin 13.1  12.0 - 15.0 g/dL    HCT 40.9  81.1 - 91.4 %    MCV 96.1  78.0 - 100.0 fL    MCH 32.3  26.0 - 34.0 pg    MCHC 33.6  30.0 - 36.0 g/dL    RDW 78.2  95.6 - 21.3 %    Platelets 365  150 - 400 K/uL    Neutrophils Relative 61  43 - 77 %    Neutro Abs 5.8  1.7 - 7.7 K/uL    Lymphocytes Relative 28  12 - 46 %    Lymphs Abs 2.6  0.7 - 4.0 K/uL    Monocytes Relative 9  3 - 12 %    Monocytes Absolute 0.8  0.1 - 1.0 K/uL    Eosinophils Relative 2  0 - 5 %    Eosinophils Absolute 0.2  0.0 - 0.7 K/uL    Basophils Relative 1  0 - 1 %    Basophils Absolute 0.1  0.0 - 0.1 K/uL   COMPREHENSIVE METABOLIC PANEL     Status: Abnormal   Collection Time   04/04/12  9:53 AM      Component Value Range Comment   Sodium 140  135 - 145 mEq/L    Potassium 3.7  3.5 - 5.1 mEq/L    Chloride 104  96 - 112 mEq/L    CO2 23  19 - 32 mEq/L    Glucose, Bld 84  70 - 99 mg/dL    BUN 17  6 - 23 mg/dL    Creatinine, Ser 0.86  0.50 - 1.10 mg/dL    Calcium 9.7  8.4 - 57.8 mg/dL    Total Protein 7.4  6.0 - 8.3 g/dL    Albumin 4.2  3.5 - 5.2 g/dL    AST 14  0 - 37 U/L    ALT 7  0 - 35 U/L    Alkaline Phosphatase 86  39 - 117 U/L    Total Bilirubin 0.3  0.3 - 1.2 mg/dL    GFR calc non Af Amer 88 (*) >90 mL/min    GFR calc Af Amer >90  >  90 mL/min     Physical Findings: AIMS: Facial and Oral Movements Muscles of Facial Expression: None, normal Lips and Perioral Area: None, normal Jaw: None, normal Tongue: None, normal,Extremity Movements Upper (arms, wrists, hands, fingers): None, normal Lower (legs, knees, ankles, toes): None, normal, Trunk Movements Neck, shoulders, hips: None, normal, Overall Severity Severity of abnormal movements (highest score from questions above): None, normal Incapacitation due to abnormal movements: None, normal Patient's awareness of abnormal movements (rate only patient's  report): No Awareness, Dental Status Current problems with teeth and/or dentures?: No Does patient usually wear dentures?: No  CIWA:  CIWA-Ar Total: 3  COWS:  COWS Total Score: 5   Treatment Plan Summary: Daily contact with patient to assess and evaluate symptoms and progress in treatment Medication management  Plan: Continue Detox protocol. Start Seroquel 50mg  po bid. Patient made aware of metabolic side effects and other side effects. Continue to monitor.  Aruna Nestler 04/06/2012, 9:01 AM

## 2012-04-06 NOTE — Progress Notes (Addendum)
D:   Patient's self inventory sheet, patient sleeps well, has good appetite, normal energy level, improving attention span.   Rated depression #2, hopelessness #1.  Has been experiencing tremors.  Denied SI.  No physical problems at this time.   Worst pain #1.  After discharge, plans to "not get high".  No questions for staff.  No problems taking meds after discharge. A:  Medications given per MD order.  Support and encouragement given throughout day.  Support and safety checks completed as ordered. R:  Following treatment plan.   Denied SI and HI.   Denied A/V hallucinations.  Patient remains safe and receptive on unit.  Patient has been cooperative and pleasant.  Patient has attended and participated in groups today.

## 2012-04-06 NOTE — Discharge Planning (Signed)
Pt was seen this morning during discharge planning group.  Pt stated that she felt good and is no longer experiencing no withdrawal symptoms.  Pt rated her level of anxiety and depression level at a five.  Pt denies SI/HI/AVH.

## 2012-04-07 NOTE — Progress Notes (Signed)
(  D) Patient complaints of mood lability.  Reports sleeping well, appetite good, energy level normal and ability to pay attention. Rates both depression and hopelessness at 1/10. Endorses agitation, denies SI/HI. (A) Educated patient on medications, encouraged patient to review coping skills. (R) Patient cooperative. Joice Lofts RN MS EdS 04/07/2012  11:34 AM

## 2012-04-07 NOTE — Progress Notes (Signed)
Psychoeducational Group Note  Date:  04/07/2012 Time:  1030  Group Topic/Focus:  Relapse Prevention Planning:   The focus of this group is to define relapse and discuss the need for planning to combat relapse.  Participation Level:  Active  Participation Quality:  Appropriate, Attentive, Sharing and Supportive  Affect:  Appropriate and Excited  Cognitive:  Alert, Appropriate and Oriented  Insight:  Good  Engagement in Group:  Good  Additional Comments:  Pt attended and participated in group discussion about Relapse Prevention and how making a plan is effective before discharge.     Dalia Heading 04/07/2012, 7:04 PM

## 2012-04-07 NOTE — Tx Team (Signed)
Interdisciplinary Treatment Plan Update (Adult)  Date:  04/07/2012  Time Reviewed:  9:50 AM   Progress in Treatment: Attending groups: Yes Participating in groups:  Yes Taking medication as prescribed: Yes Tolerating medication:  Yes Family/Significant othe contact made:  Counselor assessing for appropriate contact Patient understands diagnosis:  Yes Discussing patient identified problems/goals with staff:  Yes Medical problems stabilized or resolved:  Yes Denies suicidal/homicidal ideation: Yes Issues/concerns per patient self-inventory:  None identified Other: N/A  New problem(s) identified: None Identified  Reason for Continuation of Hospitalization: Anxiety Depression Medication stabilization Withdrawal symptoms  Interventions implemented related to continuation of hospitalization: mood stabilization, medication monitoring and adjustment, group therapy and psycho education, safety checks q 15 mins  Additional comments: N/A  Estimated length of stay: 2 days  Discharge Plan: Pt is scheduled to go to [facility] on Monday fur further treatment.  New goal(s): N/A  Review of initial/current patient goals per problem list:    1.  Goal(s): Address substance use  Met:  No  Target date: by discharge  As evidenced by: completing detox protocol and refer to appropriate treatment  2.  Goal (s): Reduce depressive and anxiety symptoms  Met:  No  Target date: by discharge  As evidenced by: Reducing depression from a 10 to a 3 as reported by pt.     Attendees: Patient:     Family:     Physician: Patrick North, MD 04/07/2012 9:50 AM   Nursing: Barrie Folk, RN 04/07/2012 9:50 AM   Case Manager:  Reyes Ivan, LCSWA 04/07/2012  9:50 AM   Counselor:  Ronda Fairly, LCSWA 04/07/2012  9:50 AM   Other:  Alease Frame, RN 04/07/2012 9:50 AM   Other:     Other:     Other:      Scribe for Treatment Team:   Reyes Ivan 04/07/2012 9:50 AM

## 2012-04-07 NOTE — Progress Notes (Signed)
Pt attended discharge planning group and actively participated in group.  SW provided pt with today's workbook.  Pt presents with anxious mood and affect.  Pt rates depression at a 1 and anxiety at a 7 today.  Pt denies SI.  Pt states that she is anxious about CPS being called on her child and what this report means.  Pt states that she went to [facility] for treatment but was referred here for detox off of Klonopin.  Pt reports this was a prescription. Pt is scheduled to go to [facility] on 10/7.  Pt states that she lives in [place] and is able to return home there.  Pt reports having transportation home.  No further needs voiced by pt at this time.  Safety planning and suicide prevention discussed.  Pt participated in discussion and acknowledged an understanding of the information provided.       Reyes Ivan, LCSWA 04/07/2012  11:47 AM

## 2012-04-07 NOTE — Progress Notes (Signed)
[facility] MD Progress Note  04/07/2012 10:45 AM S: Patient reports continued irritability. Tolerating Seroquel well, no side effects. Detoxing from the klonopin.  Diagnosis:   Axis I: Alcohol Abuse and Anxiety Disorder NOS Axis II: No diagnosis Axis III:  Past Medical History  Diagnosis Date  . Anxiety   . Mental disorder   . Depression    Axis IV: housing problems and occupational problems Axis V: 51-60 moderate symptoms  ADL's:  Intact  Sleep: Fair  Appetite:  Fair    Mental Status Examination/Evaluation: Objective:  Appearance: Casual  Eye Contact::  Fair  Speech:  Clear and Coherent  Volume:  Normal  Mood:  Irritable  Affect:  Constricted  Thought Process:  Coherent  Orientation:  Full  Thought Content:  WDL  Suicidal Thoughts:  No  Homicidal Thoughts:  No  Memory:  Immediate;   Fair Recent;   Fair Remote;   Fair  Judgement:  Fair  Insight:  Present  Psychomotor Activity:  Normal  Concentration:  Fair  Recall:  Fair  Akathisia:  No  Handed:  Right  AIMS (if indicated):     Assets:  Desire for Improvement  Sleep:  Number of Hours: 5.5    Vital Signs:Blood pressure 85/55, pulse 103, temperature 97.4 F (36.3 C), temperature source Oral, resp. rate 16, height 5\' 5"  (1.651 m), weight 57.153 kg (126 lb), last menstrual period 03/29/2012. Current Medications: Current Facility-Administered Medications  Medication Dose Route Frequency Provider Last Rate Last Dose  . acetaminophen (TYLENOL) tablet 650 mg  650 mg Oral Q6H PRN Jorje Guild, PA-C      . alum & mag hydroxide-simeth (MAALOX/MYLANTA) 200-200-20 MG/5ML suspension 30 mL  30 mL Oral Q4H PRN Jorje Guild, PA-C      . chlordiazePOXIDE (LIBRIUM) capsule 25 mg  25 mg Oral Q6H PRN Jorje Guild, PA-C   25 mg at 04/06/12 2148  . chlordiazePOXIDE (LIBRIUM) capsule 25 mg  25 mg Oral TID Jorje Guild, PA-C   25 mg at 04/07/12 0757   Followed by  . chlordiazePOXIDE (LIBRIUM) capsule 25 mg  25 mg Oral BH-qamhs Jorje Guild, PA-C       Followed by  . chlordiazePOXIDE (LIBRIUM) capsule 25 mg  25 mg Oral Daily Jorje Guild, PA-C      . hydrOXYzine (ATARAX/VISTARIL) tablet 25 mg  25 mg Oral Q6H PRN Jorje Guild, PA-C   25 mg at 04/06/12 2242  . loperamide (IMODIUM) capsule 2-4 mg  2-4 mg Oral PRN Jorje Guild, PA-C      . magnesium hydroxide (MILK OF MAGNESIA) suspension 30 mL  30 mL Oral Daily PRN Jorje Guild, PA-C   30 mL at 04/06/12 9604  . multivitamin with minerals tablet 1 tablet  1 tablet Oral Daily Jorje Guild, PA-C   1 tablet at 04/07/12 0757  . nicotine (NICODERM CQ - dosed in mg/24 hours) patch 21 mg  21 mg Transdermal Q0600 Mike Craze, MD   21 mg at 04/07/12 0655  . ondansetron (ZOFRAN-ODT) disintegrating tablet 4 mg  4 mg Oral Q6H PRN Jorje Guild, PA-C      . QUEtiapine (SEROQUEL) tablet 50 mg  50 mg Oral BID Wendel Homeyer, MD   50 mg at 04/07/12 0758  . traZODone (DESYREL) tablet 50 mg  50 mg Oral QHS PRN Jorje Guild, PA-C   50 mg at 04/06/12 2149    Lab Results:  Results for orders placed during the hospital encounter of 04/04/12 (from the past 48 hour(s))  HIV ANTIBODY (ROUTINE TESTING)     Status: Normal   Collection Time   04/06/12  6:15 AM      Component Value Range Comment   HIV NON REACTIVE  NON REACTIVE     Physical Findings: AIMS: Facial and Oral Movements Muscles of Facial Expression: None, normal Lips and Perioral Area: None, normal Jaw: None, normal Tongue: None, normal,Extremity Movements Upper (arms, wrists, hands, fingers): None, normal Lower (legs, knees, ankles, toes): None, normal, Trunk Movements Neck, shoulders, hips: None, normal, Overall Severity Severity of abnormal movements (highest score from questions above): None, normal Incapacitation due to abnormal movements: None, normal Patient's awareness of abnormal movements (rate only patient's report): No Awareness, Dental Status Current problems with teeth and/or dentures?: No Does patient usually wear dentures?: No  CIWA:  CIWA-Ar Total: 0    COWS:  COWS Total Score: 1   Treatment Plan Summary: Daily contact with patient to assess and evaluate symptoms and progress in treatment Medication management  Plan: Continue current plan of care. Patient to follow up with [facility] on Monday. Can be discharged Sunday.   Axel Meas 04/07/2012, 10:45 AM

## 2012-04-07 NOTE — Progress Notes (Signed)
Patient did attend the evening karaoke group. Pt was excited and a little intrusive but redirectable.  Pt was also engaged and participated by singing two songs.

## 2012-04-07 NOTE — Progress Notes (Signed)
D: Patient seen at the beginning of this shift. She reported feeling irritable all day. She said they started her on Seroquel and that helped. She appeared somewhat anxious and med seeking. She requested for Librium, trazodone and Vistaril at HS.  A:Pt encouraged to attend Karaoke as that would help her relaxed and calm her down. Offered Librium and trazodone at bed time and told patient to return for Vistaril after an hour.  R: Pt attended Karaoke, interacted well with peers. Returned after an hour for Vistaril. Q 15 minute check continues to maintain safety.

## 2012-04-08 MED ORDER — TRAZODONE HCL 100 MG PO TABS
100.0000 mg | ORAL_TABLET | Freq: Every evening | ORAL | Status: DC | PRN
  Administered 2012-04-08 – 2012-04-10 (×4): 100 mg via ORAL
  Filled 2012-04-08 (×10): qty 1

## 2012-04-08 MED ORDER — QUETIAPINE FUMARATE 100 MG PO TABS
100.0000 mg | ORAL_TABLET | Freq: Two times a day (BID) | ORAL | Status: DC
  Administered 2012-04-08 – 2012-04-10 (×5): 100 mg via ORAL
  Filled 2012-04-08 (×9): qty 1

## 2012-04-08 NOTE — Progress Notes (Signed)
[facility] Group Notes:  (Counselor/Nursing/MHT/Case Management/Adjunct)  04/08/2012 1515    Type of Therapy:  Psychoeducational Skills  Participation Level:  Active  Participation Quality:  Appropriate and Attentive  Affect:  Appropriate  Cognitive:  Alert and Appropriate  Insight:  Good  Engagement in Group:  Good  Engagement in Therapy:  Good  Modes of Intervention:  Activity  Summary of Progress/Problems:Coping skill group exploring distraction as a coping mechanism. Played Catch Phrase with peers and staff. Patient animated while while engaged in playing game with peers.   Karen Salas 04/08/2012, 3:56 PM

## 2012-04-08 NOTE — Progress Notes (Signed)
Psychoeducational Group Note  Date:  04/06/2012 Time:  1100  Group Topic/Focus:  Overcoming Stress:   The focus of this group is to define stress and help patients assess their triggers.  Participation Level:  Active  Participation Quality:  Appropriate  Affect:  Appropriate  Cognitive:  Appropriate  Insight:  Good  Engagement in Group:  Good  Additional Comments:   Pt attended was involved for duration.  Jacquan Savas A 04/08/2012, 2:00 PM

## 2012-04-08 NOTE — Progress Notes (Signed)
(  D) Patient alert and oriented; requesting an increase in Seroquel for anxiety. Order obtained from PA. Patient appears less anxious today. Reports that she slept well, appetite good, energy level normal, and ability to pay attention improving. (A) Supported and encouraged patient. Reviewed coping skills with patient. (R) Patient encouraged that her Seroquel dose was increased. Joice Lofts RN MS EdS 04/08/2012  1:46 PM

## 2012-04-08 NOTE — Progress Notes (Signed)
[facility] MD Progress Note  04/08/2012 1:01 PM  Diagnosis:   Axis I: Substance Abuse and Substance Induced Mood Disorder Axis II: Deferred Axis III:  Past Medical History  Diagnosis Date  . Anxiety   . Mental disorder   . Depression    Subjective: Karen Salas is complaining of significant anxiety with racing thoughts and poor sleep. Her appetite is good. She denies any current cravings for cocaine or other substances other than nicotine. She is looking forward to going to treatment at [facility], but is concerned that if she goes home first she may get high. She denies any suicidal or homicidal ideation. She denies any auditory or visual hallucinations.  ADL's:  Intact  Sleep: Poor  Appetite:  Good  Suicidal Ideation:  Patient denies any thought, plan, or intent Homicidal Ideation:  Patient denies any thought, plan, or intent  AEB (as evidenced by):  Mental Status Examination/Evaluation: Objective:  Appearance: Casual  Eye Contact::  Good  Speech:  Clear and Coherent  Volume:  Normal  Mood:  Anxious  Affect:  Congruent  Thought Process:  Linear  Orientation:  Full  Thought Content:  WDL  Suicidal Thoughts:  No  Homicidal Thoughts:  No  Memory:  Immediate;   Good Recent;   Good Remote;   Good  Judgement:  Fair  Insight:  Fair  Psychomotor Activity:  Normal  Concentration:  Good  Recall:  Good  Akathisia:  Yes  Handed:    AIMS (if indicated):     Assets:  Desire for Improvement Physical Health Resilience  Sleep:  Number of Hours: 6.75    Vital Signs:Blood pressure 95/65, pulse 106, temperature 97.4 F (36.3 C), temperature source Oral, resp. rate 16, height 5\' 5"  (1.651 m), weight 57.153 kg (126 lb), last menstrual period 03/29/2012. Current Medications: Current Facility-Administered Medications  Medication Dose Route Frequency Provider Last Rate Last Dose  . acetaminophen (TYLENOL) tablet 650 mg  650 mg Oral Q6H PRN Jorje Guild, PA-C      . alum & mag hydroxide-simeth  (MAALOX/MYLANTA) 200-200-20 MG/5ML suspension 30 mL  30 mL Oral Q4H PRN Jorje Guild, PA-C   30 mL at 04/07/12 1333  . chlordiazePOXIDE (LIBRIUM) capsule 25 mg  25 mg Oral Q6H PRN Jorje Guild, PA-C   25 mg at 04/07/12 1258  . chlordiazePOXIDE (LIBRIUM) capsule 25 mg  25 mg Oral BH-qamhs Jorje Guild, PA-C   25 mg at 04/08/12 1610   Followed by  . chlordiazePOXIDE (LIBRIUM) capsule 25 mg  25 mg Oral Daily Jorje Guild, PA-C      . hydrOXYzine (ATARAX/VISTARIL) tablet 25 mg  25 mg Oral Q6H PRN Jorje Guild, PA-C   25 mg at 04/06/12 2242  . loperamide (IMODIUM) capsule 2-4 mg  2-4 mg Oral PRN Jorje Guild, PA-C      . magnesium hydroxide (MILK OF MAGNESIA) suspension 30 mL  30 mL Oral Daily PRN Jorje Guild, PA-C   30 mL at 04/06/12 9604  . multivitamin with minerals tablet 1 tablet  1 tablet Oral Daily Jorje Guild, PA-C   1 tablet at 04/08/12 0743  . nicotine (NICODERM CQ - dosed in mg/24 hours) patch 21 mg  21 mg Transdermal Q0600 Mike Craze, MD   21 mg at 04/08/12 5409  . ondansetron (ZOFRAN-ODT) disintegrating tablet 4 mg  4 mg Oral Q6H PRN Jorje Guild, PA-C      . QUEtiapine (SEROQUEL) tablet 100 mg  100 mg Oral BID Jorje Guild, PA-C      .  traZODone (DESYREL) tablet 100 mg  100 mg Oral QHS,MR X 1 Jorje Guild, PA-C      . DISCONTD: QUEtiapine (SEROQUEL) tablet 50 mg  50 mg Oral BID Himabindu Ravi, MD   50 mg at 04/08/12 0742  . DISCONTD: traZODone (DESYREL) tablet 50 mg  50 mg Oral QHS PRN Jorje Guild, PA-C   50 mg at 04/07/12 2133    Lab Results: No results found for this or any previous visit (from the past 48 hour(s)).  Physical Findings: AIMS: Facial and Oral Movements Muscles of Facial Expression: None, normal Lips and Perioral Area: None, normal Jaw: None, normal Tongue: None, normal,Extremity Movements Upper (arms, wrists, hands, fingers): None, normal Lower (legs, knees, ankles, toes): None, normal, Trunk Movements Neck, shoulders, hips: None, normal, Overall Severity Severity of abnormal movements  (highest score from questions above): None, normal Incapacitation due to abnormal movements: None, normal Patient's awareness of abnormal movements (rate only patient's report): No Awareness, Dental Status Current problems with teeth and/or dentures?: No Does patient usually wear dentures?: No  CIWA:  CIWA-Ar Total: 0  COWS:  COWS Total Score: 1   Treatment Plan Summary: Daily contact with patient to assess and evaluate symptoms and progress in treatment Medication management  Plan: We will increase her trazodone to 100 mg at bedtime with a repeat if necessary. We will also increase her Seroquel to 100 mg twice daily. She will discharge to [facility] when ready.  Marene Gilliam 04/08/2012, 1:01 PM

## 2012-04-08 NOTE — Progress Notes (Signed)
Patient did attend the evening speaker AA meeting.  

## 2012-04-08 NOTE — [facility] Suicide Risk Assessment (Signed)
Suicide Risk Assessment  Discharge Assessment     Demographic Factors:  Caucasian  Mental Status Per Nursing Assessment::   On Admission:     Current Mental Status by Physician: NA  Loss Factors: Financial problems/change in socioeconomic status  Historical Factors: Impulsivity  Risk Reduction Factors:   Positive therapeutic relationship  Continued Clinical Symptoms:  Alcohol/Substance Abuse/Dependencies  Cognitive Features That Contribute To Risk:  Closed-mindedness    Suicide Risk:  Minimal: No identifiable suicidal ideation.  Patients presenting with no risk factors but with morbid ruminations; may be classified as minimal risk based on the severity of the depressive symptoms  Discharge Diagnoses:   AXIS I:  Substance Abuse and Substance Induced Mood Disorder AXIS II:  Deferred AXIS III:   Past Medical History  Diagnosis Date  . Anxiety   . Mental disorder   . Depression    AXIS IV:  other psychosocial or environmental problems AXIS V:  51-60 moderate symptoms  Plan Of Care/Follow-up recommendations:  Activity:  [facility]  Is patient on multiple antipsychotic therapies at discharge:  No   Has Patient had three or more failed trials of antipsychotic monotherapy by history:  No  Recommended Plan for Multiple Antipsychotic Therapies: Additional reason(s) for multiple antispychotic treatment:  NA  Estanislao Harmon 04/08/2012, 3:58 PM

## 2012-04-08 NOTE — Progress Notes (Signed)
Patient ID: Karen Salas, female   DOB: 04/24/1986, 26 y.o.   MRN: [id number] [facility] Group Notes:  (Counselor/Nursing/MHT/Case Management/Adjunct)  04/08/2012 1:15 PM  Type of Therapy:  Group Therapy, Dance/Movement Therapy   Participation Level:  Minimal  Participation Quality:  Intrusive and Resistant  Affect:  Angry, Anxious and Irritable  Cognitive:  Appropriate  Insight:  Limited  Engagement in Group:  Limited  Engagement in Therapy:  Limited  Modes of Intervention:  Clarification, Problem-solving, Role-play, Socialization and Support  Summary of Progress/Problems:  Pt participated in counseling group on healthy coping skills.  Pt was able to identify potential coping skills to utilize at D/C and participated in group processing of the differentiation of healthy and unhealthy coping skills. Pt expressed difficulties and frustrations during group and was unable to stay during group, leaving quickly.  Following group, pt returned and verbally expressed her anger and frustrations to the counselor confirming the pt's earlier statement about her mood being unstable.  Pt may not be ready for D/C on Sunday.    Debarah Crape 04/08/2012. 2:40 PM

## 2012-04-08 NOTE — Progress Notes (Signed)
[facility] Group Notes:  (Counselor/Nursing/MHT/Case Management/Adjunct)  04/08/2012 1000  Type of Therapy:  Psychoeducational Skills  Participation Level:  Active  Participation Quality:  Appropriate  Affect:  Appropriate  Cognitive:  Alert and Appropriate  Insight:  Good  Engagement in Group:  Good  Engagement in Therapy:  Good  Modes of Intervention:  Clarification, Education and Role-play  Summary of Progress/Problems:Patient exhibited good insight into topic of Healthy Communication. Patient agreed to role play with Clinical research associate. Patient able to demonstrate reframing skills.   Shareta Fishbaugh 04/08/2012, 11:26 AM

## 2012-04-08 NOTE — Progress Notes (Signed)
Patient ID: Karen Salas, female   DOB: 1985/08/24, 26 y.o.   MRN: [id number]   D: Patient pleasant on approach today. Reports mood much improved from first admitted. States that she may be discharged over the weekend. Does say that her mood still goes up and down and wants Seroquel increased. Reports that she thought that the physician was going to increase but did not. Physician note left. Currently denies SI. A: Staff will monitor on q 15 minutes and follow treatments and give meds as ordered. R: Taking meds without issue and attended group.

## 2012-04-08 NOTE — Progress Notes (Signed)
Patient ID: Karen Salas, female   DOB: 19-Dec-1985, 26 y.o.   MRN: [id number] Pt. attended and participated in aftercare planning group. Pt. accepted information on suicide prevention, warning signs to look for with suicide and crisis line numbers to use. The pt. agreed to call crisis line numbers if having warning signs or having thoughts of suicide. Pt. listed their current anxiety level as 5 out of 10 and depression as none.  The pt stated that her medication is not working for her and expressed concerns over how unstable her mood is.  The client stated that she is concerned about her mood changing rapidly and stated that she goes from irritable to sad to angry very quickly.  The pt stated that she will be meeting with the MD today and will express her concerns to the MD.

## 2012-04-08 NOTE — Progress Notes (Signed)
Psychoeducational Group Note  Date:  04/07/2012 Time:  2000  Group Topic/Focus:  AA group  Participation Level:  Active  Participation Quality:  Appropriate  Affect:  Appropriate  Cognitive:  Alert  Insight:  Good  Engagement in Group:  Good  Additional Comments:    Yamaira Spinner R 04/08/2012, 4:29 AM

## 2012-04-09 DIAGNOSIS — F1910 Other psychoactive substance abuse, uncomplicated: Secondary | ICD-10-CM

## 2012-04-09 DIAGNOSIS — F1994 Other psychoactive substance use, unspecified with psychoactive substance-induced mood disorder: Secondary | ICD-10-CM

## 2012-04-09 MED ORDER — GABAPENTIN 100 MG PO CAPS
100.0000 mg | ORAL_CAPSULE | Freq: Three times a day (TID) | ORAL | Status: DC
  Administered 2012-04-09 – 2012-04-10 (×5): 100 mg via ORAL
  Filled 2012-04-09 (×10): qty 1

## 2012-04-09 MED ORDER — CHLORDIAZEPOXIDE HCL 5 MG PO CAPS
10.0000 mg | ORAL_CAPSULE | Freq: Three times a day (TID) | ORAL | Status: DC | PRN
  Administered 2012-04-09 – 2012-04-11 (×5): 10 mg via ORAL
  Filled 2012-04-09 (×5): qty 2
  Filled 2012-04-09 (×2): qty 1

## 2012-04-09 NOTE — Progress Notes (Signed)
Patient ID: Karen Salas, female   DOB: 18-May-1986, 26 y.o.   MRN: [id number] Pt. attended and participated in aftercare planning group. Pt. accepted information on suicide prevention, warning signs to look for with suicide and crisis line numbers to use. The pt. agreed to call crisis line numbers if having warning signs or having thoughts of suicide. Pt. Received daily workbook.  Pt stated that she does not feel she is ready to leave.  Pt expressed that she is feeling shaky, and that she thinks she will go get high after leaving here unless she goes to tx immediately.  Pt was told that she would not be leaving today.

## 2012-04-09 NOTE — Progress Notes (Addendum)
D: Patient exhibits improved mood today. She has bright affect and good eye contact. Energy level normal, appetite good, energy level normal and ability to pay attention improving. Rates depression and sense of hopelessness at 1/10. Continues to endorse tremors, cravings and agitation. Denies SI/HI. A: Encouraged patient to review coping skills. Patient able to verbalize several coping skills. R: Verbalizes desire to "not get high" while waiting on bed at [facility]. Joice Lofts RN MS EdS 04/09/2012  8:39 AM  Patient complaints of "heartburn and indigestion". Mylanta given with relief. Patient started on Neurontin for anxiety. Joice Lofts RN MS EdS 04/09/2012  11:36 AM

## 2012-04-09 NOTE — Progress Notes (Signed)
Patient ID: Karen Salas, female   DOB: Mar 23, 1986, 26 y.o.   MRN: [id number] D)  Was out and about on hall. Participating in the milieu this evening, affect rather sad, but trying to be bright, trying to be positive.  States still having feelings of anxiety, still feeling somewhat shaky.  Attended AA group, associating with select peers but interacts well with others and staff, polite and cooperative.   A)  Will continue to monitor for safety, continue POC, support R)  Remains sad, appreciative, gaining insight.

## 2012-04-09 NOTE — Progress Notes (Signed)
Patient did not attend this evenings speaker AA meeting.  

## 2012-04-09 NOTE — Progress Notes (Signed)
D: Patient's mood labile this afternoon. Became involved in a conflict while in the cafeteria and became angry, yelling at another patient.  A: Patient encouraged to spend time calming down.  R: She was in hallway yelling and crying and cussing at another patient. Therapist started group to have patient's process outburst in the cafeteria and patient stormed out of group and slammed the door to her room.Eventually she calmed down and apologized. Joice Lofts RN MS EdS 04/09/2012  6:18 PM

## 2012-04-09 NOTE — Progress Notes (Signed)
[facility] MD Progress Note  04/09/2012 10:39 AM  Diagnosis:   Axis I: Substance Abuse and Substance Induced Mood Disorder Axis II: Deferred Axis III:  Past Medical History  Diagnosis Date  . Anxiety   . Mental disorder   . Depression    Subjective: Karen Salas does not feel ready to be discharged, and is concerned that if she leaves today she will relapse. She continues to experience tremors, and feels that she is still in withdrawal. She endorses having nightmares last night. Her appetite is improving. She denies any suicidal ideation. She does have some thoughts of hurting other people, but has no plan or intent. She denies any suicidal or homicidal ideation.   ADL's:  Intact  Sleep: Fair  Appetite:  Good  Suicidal Ideation:  Patient denies any thought, plan, or intent Homicidal Ideation:  Patient has thoughts of hurting others who have hurt her in the past, but denies any plan or intent  AEB (as evidenced by):  Mental Status Examination/Evaluation: Objective:  Appearance: Disheveled  Eye Contact::  Good  Speech:  Clear and Coherent  Volume:  Normal  Mood:  Anxious and Dysphoric  Affect:  Congruent  Thought Process:  Logical  Orientation:  Full  Thought Content:  WDL  Suicidal Thoughts:  No  Homicidal Thoughts:  Yes.  without intent/plan  Memory:  Immediate;   Good Recent;   Good Remote;   Good  Judgement:  Fair  Insight:  Fair  Psychomotor Activity:  Normal  Concentration:  Good  Recall:  Good  Akathisia:  Yes  Handed:    AIMS (if indicated):     Assets:  Communication Skills Desire for Improvement Physical Health  Sleep:  Number of Hours: 4.5    Vital Signs:Blood pressure 106/66, pulse 114, temperature 97.5 F (36.4 C), temperature source Oral, resp. rate 18, height 5\' 5"  (1.651 m), weight 57.153 kg (126 lb), last menstrual period 03/29/2012. Current Medications: Current Facility-Administered Medications  Medication Dose Route Frequency Provider Last Rate Last Dose   . acetaminophen (TYLENOL) tablet 650 mg  650 mg Oral Q6H PRN Jorje Guild, PA-C      . alum & mag hydroxide-simeth (MAALOX/MYLANTA) 200-200-20 MG/5ML suspension 30 mL  30 mL Oral Q4H PRN Jorje Guild, PA-C   30 mL at 04/09/12 1038  . chlordiazePOXIDE (LIBRIUM) capsule 10 mg  10 mg Oral TID PRN Jorje Guild, PA-C      . chlordiazePOXIDE (LIBRIUM) capsule 25 mg  25 mg Oral Daily Jorje Guild, PA-C   25 mg at 04/09/12 0751  . gabapentin (NEURONTIN) capsule 100 mg  100 mg Oral TID Jorje Guild, PA-C      . magnesium hydroxide (MILK OF MAGNESIA) suspension 30 mL  30 mL Oral Daily PRN Jorje Guild, PA-C   30 mL at 04/09/12 1039  . multivitamin with minerals tablet 1 tablet  1 tablet Oral Daily Jorje Guild, PA-C   1 tablet at 04/09/12 0751  . nicotine (NICODERM CQ - dosed in mg/24 hours) patch 21 mg  21 mg Transdermal Q0600 Mike Craze, MD   21 mg at 04/08/12 1610  . QUEtiapine (SEROQUEL) tablet 100 mg  100 mg Oral BID Jorje Guild, PA-C   100 mg at 04/09/12 0751  . traZODone (DESYREL) tablet 100 mg  100 mg Oral QHS,MR X 1 Jorje Guild, PA-C   100 mg at 04/08/12 2315  . DISCONTD: QUEtiapine (SEROQUEL) tablet 50 mg  50 mg Oral BID Himabindu Ravi, MD   50 mg at  04/08/12 0742  . DISCONTD: traZODone (DESYREL) tablet 50 mg  50 mg Oral QHS PRN Jorje Guild, PA-C   50 mg at 04/07/12 2133    Lab Results: No results found for this or any previous visit (from the past 48 hour(s)).  Physical Findings: AIMS: Facial and Oral Movements Muscles of Facial Expression: None, normal Lips and Perioral Area: None, normal Jaw: None, normal Tongue: None, normal,Extremity Movements Upper (arms, wrists, hands, fingers): None, normal Lower (legs, knees, ankles, toes): None, normal, Trunk Movements Neck, shoulders, hips: None, normal, Overall Severity Severity of abnormal movements (highest score from questions above): None, normal Incapacitation due to abnormal movements: None, normal Patient's awareness of abnormal movements (rate only  patient's report): No Awareness, Dental Status Current problems with teeth and/or dentures?: No Does patient usually wear dentures?: No  CIWA:  CIWA-Ar Total: 0  COWS:  COWS Total Score: 1   Treatment Plan Summary: Daily contact with patient to assess and evaluate symptoms and progress in treatment Medication management  Plan: We will start her on a low dose of Neurontin to address anxiety and mood swings. We will also continue a low dose of Librium as needed for extended withdrawal from Klonopin. We will continue to arrange for followup at [facility] when a bed becomes available.  Karen Salas 04/09/2012, 10:39 AM

## 2012-04-09 NOTE — Progress Notes (Signed)
[facility] Group Notes:  (Counselor/Nursing/MHT/Case Management/Adjunct)  04/09/2012 1:15 PM  Type of Therapy:  Group Therapy, Dance/Movement Therapy   Participation Level:  None  Participation Quality:  Intrusive, Monopolizing and Resistant  Affect:  Angry and Depressed  Cognitive:  Confused  Insight:  Limited  Engagement in Group:  None  Engagement in Therapy:  None  Modes of Intervention:  Clarification, Problem-solving, Role-play, Socialization and Support  Summary of Progress/Problems:  Group discussed a current conflict on the unit and how positive choices, coping skills and supports can be used to bring about a positive outcome. Relating to the current conflict group discussed possible negative effects of subsuting a realionship for the drug of choice in recovery and how this can lead to relapse. Pt became very angry in the first few minutes of group and stormed out      [facility] 04/09/2012. 3:48 PM

## 2012-04-09 NOTE — Progress Notes (Signed)
[facility] Group Notes:  (Counselor/Nursing/MHT/Case Management/Adjunct)  04/09/2012 1000  Type of Therapy:  Psychoeducational Skills  Participation Level:  Active  Participation Quality:  Attentive  Affect:  Appropriate  Cognitive:  Appropriate  Insight:  Good  Engagement in Group:  Good  Engagement in Therapy:  Good  Modes of Intervention:  Activity  Summary of Progress/Problems:participated in group, had a good time sharing   Roselee Culver 04/09/2012, 11:22 AM

## 2012-04-10 DIAGNOSIS — F1920 Other psychoactive substance dependence, uncomplicated: Secondary | ICD-10-CM

## 2012-04-10 MED ORDER — GABAPENTIN 100 MG PO CAPS: 100.0000 mg | ORAL_CAPSULE | Freq: Three times a day (TID) | ORAL | Status: DC

## 2012-04-10 MED ORDER — INFLUENZA VIRUS VACC SPLIT PF IM SUSP
0.5000 mL | Freq: Once | INTRAMUSCULAR | Status: AC
  Administered 2012-04-10: 0.5 mL via INTRAMUSCULAR

## 2012-04-10 MED ORDER — TRAZODONE HCL 100 MG PO TABS: 100.0000 mg | ORAL_TABLET | Freq: Every evening | ORAL | Status: DC | PRN

## 2012-04-10 MED ORDER — QUETIAPINE FUMARATE 100 MG PO TABS: 100.0000 mg | ORAL_TABLET | Freq: Two times a day (BID) | ORAL | Status: DC

## 2012-04-10 NOTE — Progress Notes (Signed)
Group Note  Date:  04/10/2012 Time:  1:15  Group Topic/Focus:  Overcoming Obstacles to Wellness  Participation Level:  Active  Participation Quality:  Sharing  Affect:  Anxious  Cognitive:  Appropriate  Insight:  Good  Engagement in Group:  Good  Additional Comments:  Loralye actively participated in group. She shared how she would like to stop using and would like to see her kids again. She answered many questions and contributed a lot to group.  Robecca Fulgham S 04/10/2012, 2:56 PM

## 2012-04-10 NOTE — Progress Notes (Signed)
Psychoeducational Group Note  Date:  04/10/2012 Time:  1000  Group Topic/Focus:  Wellness Toolbox:   The focus of this group is to discuss various aspects of wellness, balancing those aspects and exploring ways to increase the ability to experience wellness.  Patients will create a wellness toolbox for use upon discharge.  Participation Level:  Active  Participation Quality:  Appropriate, Sharing and Supportive  Affect:  Appropriate  Cognitive:  Appropriate  Insight:  Good  Engagement in Group:  Good  Additional Comments:  none  Careli Luzader M 04/10/2012, 12:37 PM

## 2012-04-10 NOTE — Progress Notes (Signed)
D: Patient denies SI/HI and A/V hallucinations; patient reports sleep to be well; reports appetite to be good; reports energy level is improving ; reports ability to pay attention is good  A: Monitored q 15 minutes; patient encouraged to attend groups; patient given medications per physician orders; patient encouraged to express feelings and/or concerns  R: Patient is animated about the mileu; patient's interaction with staff and peers is appropriate; patient is taking medications as prescribed and tolerating medications; patient is attending all groups

## 2012-04-10 NOTE — Tx Team (Signed)
Interdisciplinary Treatment Plan Update (Adult)  Date:  04/10/2012  Time Reviewed:  10:01 AM   Progress in Treatment: Attending groups: Yes Participating in groups:  Yes Taking medication as prescribed: Yes Tolerating medication:  Yes Family/Significant othe contact made:  No Patient understands diagnosis:  Yes Discussing patient identified problems/goals with staff:  Yes Medical problems stabilized or resolved:  Yes Denies suicidal/homicidal ideation: Yes Issues/concerns per patient self-inventory:  None identified Other: N/A  New problem(s) identified: None Identified  Reason for Continuation of Hospitalization: Anxiety Depression Withdrawal Symptoms Medication stabilization  Interventions implemented related to continuation of hospitalization: mood stabilization, medication monitoring and adjustment, group therapy and psycho education, safety checks q 15 mins  Additional comments: N/A  Estimated length of stay: 1-2 days  Discharge Plan: SW is assessing for appropriate referrals - [facility] or [facility] for further treatment.    New goal(s): N/A  Review of initial/current patient goals per problem list:    1.  Goal(s): Address substance use  Met:  No  Target date: by discharge  As evidenced by: completing detox protocol and refer to appropriate treatment  2.  Goal (s): Reduce depressive symptoms  Met:  No  Target date: by discharge  As evidenced by: Reducing depression from a 10 to a 3 as reported by pt. Pt rates anxiety at a 8-9 today.     Attendees: Patient:     Family:     Physician: Patrick North, MD 04/10/2012 10:01 AM   Nursing: Roswell Miners, RN 04/10/2012 10:01 AM   Case Manager:  Reyes Ivan, LCSWA 04/10/2012  10:01 AM   Counselor:  Ronda Fairly, LCSWA 04/10/2012  10:01 AM   Other:  Alease Frame, RN 04/10/2012 10:01 AM   Other:     Other:     Other:      Scribe for Treatment Team:   Reyes Ivan 04/10/2012 10:01 AM

## 2012-04-10 NOTE — Progress Notes (Signed)
Patient ID: Karen Salas, female   DOB: [birth year], 26 y.o.   MRN: [id number] She has been up and to groups, interacting with peers and staff. Has requested and received prn for what she says is a panic  Attacks (anxiety). Self inventory: depressed and hopeless at 1, denies SI thoughts, reports w/d's symptoms of tremors and reports no physical pain.

## 2012-04-10 NOTE — Progress Notes (Signed)
Patient ID: Karen Salas, female   DOB: 16-Jun-1986, 26 y.o.   MRN: [id number] D)  Was in her room this evening, reported to mht that she was feeling anxious, feeling bad, asked to see MD.  Was upset over events of the day, tearful, asked for RN to see her.  Fell asleep shortly after this and didn't wake up when her name was called several times, slept through group,  and has continued to sleep tonight.  A)  Will continue to monitor for safety, continue POC.

## 2012-04-10 NOTE — Progress Notes (Signed)
[facility] MD Progress Note  04/10/2012 9:24 AM S: Patient reports feeling very anxious, states she had a panic attack yesterday. Got into an argument with another patient. Continues to have some tremors, she reports being worried about relapsing if she is discharged.  Diagnosis:   Axis I: Substance abuse, Anxiety d/o nos Axis II: No diagnosis Axis III:  Past Medical History  Diagnosis Date  . Anxiety   . Mental disorder   . Depression    Axis IV: economic problems, housing problems and occupational problems Axis V: 51-60 moderate symptoms  ADL's:  Intact  Sleep: Fair  Appetite:  Fair   Mental Status Examination/Evaluation: Objective:  Appearance: Casual  Eye Contact::  Fair  Speech:  Pressured  Volume:  Normal  Mood:  Anxious  Affect:  Constricted  Thought Process:  Coherent  Orientation:  Full  Thought Content:  WDL  Suicidal Thoughts:  No  Homicidal Thoughts:  No  Memory:  Immediate;   Fair Recent;   Fair Remote;   Fair  Judgement:  Fair  Insight:  Present  Psychomotor Activity:  Normal  Concentration:  Fair  Recall:  Fair  Akathisia:  No  Handed:  Right  AIMS (if indicated):     Assets:  Desire for Improvement  Sleep:  Number of Hours: 6.75    Vital Signs:Blood pressure 106/66, pulse 114, temperature 97.5 F (36.4 C), temperature source Oral, resp. rate 18, height 5\' 5"  (1.651 m), weight 57.153 kg (126 lb), last menstrual period 03/29/2012. Current Medications: Current Facility-Administered Medications  Medication Dose Route Frequency Provider Last Rate Last Dose  . acetaminophen (TYLENOL) tablet 650 mg  650 mg Oral Q6H PRN Jorje Guild, PA-C      . alum & mag hydroxide-simeth (MAALOX/MYLANTA) 200-200-20 MG/5ML suspension 30 mL  30 mL Oral Q4H PRN Jorje Guild, PA-C   30 mL at 04/09/12 1038  . chlordiazePOXIDE (LIBRIUM) capsule 10 mg  10 mg Oral TID PRN Jorje Guild, PA-C   10 mg at 04/10/12 4782  . gabapentin (NEURONTIN) capsule 100 mg  100 mg Oral TID Jorje Guild, PA-C    100 mg at 04/10/12 0806  . magnesium hydroxide (MILK OF MAGNESIA) suspension 30 mL  30 mL Oral Daily PRN Jorje Guild, PA-C   30 mL at 04/09/12 1039  . multivitamin with minerals tablet 1 tablet  1 tablet Oral Daily Jorje Guild, PA-C   1 tablet at 04/10/12 0806  . nicotine (NICODERM CQ - dosed in mg/24 hours) patch 21 mg  21 mg Transdermal Q0600 Mike Craze, MD   21 mg at 04/10/12 0643  . QUEtiapine (SEROQUEL) tablet 100 mg  100 mg Oral BID Jorje Guild, PA-C   100 mg at 04/10/12 0806  . traZODone (DESYREL) tablet 100 mg  100 mg Oral QHS,MR X 1 Jorje Guild, PA-C   100 mg at 04/08/12 2315    Lab Results: No results found for this or any previous visit (from the past 48 hour(s)).  Physical Findings: AIMS: Facial and Oral Movements Muscles of Facial Expression: None, normal Lips and Perioral Area: None, normal Jaw: None, normal Tongue: None, normal,Extremity Movements Upper (arms, wrists, hands, fingers): None, normal Lower (legs, knees, ankles, toes): None, normal, Trunk Movements Neck, shoulders, hips: None, normal, Overall Severity Severity of abnormal movements (highest score from questions above): None, normal Incapacitation due to abnormal movements: None, normal Patient's awareness of abnormal movements (rate only patient's report): No Awareness, Dental Status Current problems with teeth and/or dentures?: No Does  patient usually wear dentures?: No  CIWA:  CIWA-Ar Total: 0  COWS:  COWS Total Score: 1   Treatment Plan Summary: Daily contact with patient to assess and evaluate symptoms and progress in treatment Medication management  Plan: Continue current plan of care. Plan for discharge tomorrow if patient completes detox and does not have any withdrawal symptoms.  Madisen Ludvigsen 04/10/2012, 9:24 AM

## 2012-04-10 NOTE — Progress Notes (Addendum)
Pt attended discharge planning group and actively participated in group.  SW provided pt with today's workbook.  Pt presents with anxious mood and affect.  Pt denies having depression and SI/HI and rates anxiety at a 8-9 today.  Pt states that she didn't feel ready to d/c yesterday due to feeling like she would relapse.  Pt states that she would rather go straight to treatment from here.  Pt is able to go to [facility] tomorrow, with a bed available. Pt will d/c early tomorrow morning. No further needs voiced by pt at this time.   Safety planning and suicide prevention discussed.  Pt participated in discussion and acknowledged an understanding of the information provided.       Reyes Ivan, LCSWA 04/10/2012  10:25 AM

## 2012-04-10 NOTE — Progress Notes (Signed)
[facility] Group Notes:  (Counselor/Nursing/MHT/Case Management/Adjunct)  04/10/2012 3:55 PM  Type of Therapy: This group is designed to assist the patient in identifying activities that they will incorporate into their daily living with the intention of improving or restoring emotional, physical, spiritual, interpersonal and financial health.   Participation Level:  Active  Participation Quality:  Appropriate  Affect:  Appropriate  Cognitive:  Appropriate  Insight:  Good  Engagement in Group:  Good  Engagement in Therapy:  Good  Modes of Intervention:  Education  Summary of Progress/Problems:patient stated that she was an addict and that her self care plans consisted of attending a 30 day treatment program, [facility] and to start attending NA twice a week.   Ardelle Park O 04/10/2012, 3:55 PM

## 2012-04-10 NOTE — [facility] Suicide Risk Assessment (Signed)
Suicide Risk Assessment  Discharge Assessment     Demographic Factors:  Female, caucasian, unemployed  Mental Status Per Nursing Assessment::   On Admission:     Current Mental Status by Physician: Patient alert, oriented to 4. Looking forward to discharge and starting long term treatment. Speech normal in rate and volume. Denies AH/VH/SI/HI.  Loss Factors: Decrease in vocational status and Financial problems/change in socioeconomic status  Historical Factors: Impulsivity  Risk Reduction Factors:   Responsible for children under 37 years of age  Continued Clinical Symptoms:  Alcohol/Substance Abuse/Dependencies  Cognitive Features That Contribute To Risk:  Thought constriction (tunnel vision)    Suicide Risk:  Minimal: No identifiable suicidal ideation.  Patients presenting with no risk factors but with morbid ruminations; may be classified as minimal risk based on the severity of the depressive symptoms  Discharge Diagnoses:   AXIS I:  Substance Dependence AXIS II:  No diagnosis AXIS III:   Past Medical History  Diagnosis Date  . Anxiety   . Mental disorder   . Depression    AXIS IV:  economic problems, housing problems and occupational problems AXIS V:  61-70 mild symptoms  Plan Of Care/Follow-up recommendations:  Activity:  normal Diet:  normal Follow up with [facility] and continue discharge medications.  Is patient on multiple antipsychotic therapies at discharge:  No   Has Patient had three or more failed trials of antipsychotic monotherapy by history:  No  Recommended Plan for Multiple Antipsychotic Therapies: NA  Psalms Olarte 04/10/2012, 2:59 PM

## 2012-04-11 MED ORDER — QUETIAPINE FUMARATE 100 MG PO TABS
100.0000 mg | ORAL_TABLET | Freq: Two times a day (BID) | ORAL | Status: DC
  Filled 2012-04-11 (×3): qty 28

## 2012-04-11 MED ORDER — GABAPENTIN 100 MG PO CAPS
100.0000 mg | ORAL_CAPSULE | Freq: Three times a day (TID) | ORAL | Status: DC
  Filled 2012-04-11 (×3): qty 42

## 2012-04-11 MED ORDER — TRAZODONE HCL 100 MG PO TABS
100.0000 mg | ORAL_TABLET | Freq: Every evening | ORAL | Status: DC | PRN
  Filled 2012-04-11 (×2): qty 28

## 2012-04-11 NOTE — Progress Notes (Signed)
Patient ID: Karen Salas, female   DOB: 1986-03-27, 26 y.o.   MRN: [id number] D: Pt. Eyes closed, resp. Even,unlabored. A: Pt. Is monitored q36min for safety. R: Pt. Is safe on the unit, no distress noted.

## 2012-04-11 NOTE — Progress Notes (Signed)
[facility] Case Management Discharge Plan:  Will you be returning to the same living situation after discharge: Yes,  can return home after treatment At discharge, do you have transportation home?:Yes,  family picked pt up Do you have the ability to pay for your medications:Yes,  access to meds   Release of information consent forms completed and in the chart;  Patient's signature needed at discharge.  Patient to Follow up at:  Follow-up Information    Follow up with [facility] . On 04/11/2012. (arrive at 8:00a.m promptly)    Contact information:   [address]  [place], [place] [address]  [phone]         Patient denies SI/HI:   Yes,  denies SI/HI    Safety Planning and Suicide Prevention discussed:  Yes,  discussed with pt   Barrier to discharge identified:No.  Summary and Recommendations: Pt reports feeling ready for d/c today. No recommendations from SW.  No further needs voiced by pt.  Pt stable to discharge.     Carmina Miller 04/11/2012, 8:13 AM

## 2012-04-11 NOTE — Discharge Summary (Signed)
Physician Discharge Summary Note  Patient:  Karen Salas is an 26 y.o., female MRN:  [id number] DOB:  October 21, 1985 Patient phone:  [phone] (home)  Patient address:   [address] [place] [address]   Date of Admission:  04/04/2012 Date of Discharge: 04/11/2012  Discharge Diagnoses: Benzodiazepine Dependence  Axis Diagnosis: AXIS I: PolySubstance Dependence (cocaine, Klonopin, cannabis) AXIS II: No diagnosis  AXIS III:  Past Medical History   Diagnosis  Date   .  Anxiety    .  Mental disorder    .  Depression     AXIS IV: economic problems, housing problems and occupational problems  AXIS V: 61-70 mild symptoms    Level of Care:  Inpatient Hospitalization.  Reason for hospitalization:This is a 26 year old Caucasian female, admitted to [facility] from the [facility] ED with complaints of increased depression, panic attacks and substance abuse issues. Patient reports, "I went to [facility] yesterday to be admitted for substance abuse treatment. I was told to go to the hospital and get detoxification treatment for Klonopin. I was informed that I cannot be on this anxiety medicines while I am receiving substance abuse treatment. I have been smoking crack cocaine since I was 26 years old. I currently use only on weekends. This past weekend, I used crack worth of $500.00 - $1000.00. I use crack because it helps me stay focus. I was prescribed Klonopin by my outpatient provider. I was suppose to take it bid, but at times I take more that depending on the level of my anxiety at the time. But I am sick and tired of smoking my life away. I have been jailed for being caught with drug paraphernalia. I have lost one of my children because I was on drugs and could not care for them. Using drugs helps me keep my mind from racing, keeps me from being restless and anxious. I have a lot of random thoughts. I was in this hospital when I was 16 for suicide attempts".      Hospital  Course:   The patient attended treatment team meeting this am and met with treatment team members. The patient's symptoms, treatment plan and response to treatment was discussed. The patient endorsed that their symptoms have improved. The patient also stated that they felt stable for discharge.  They reported that from this hospital stay they had learned many coping skills.  In other to maintain their psychiatric stability, they will continue psychiatric care on an outpatient basis. They will follow-up as outlined below.  In addition they were instructed  to take all your medications as prescribed by their mental healthcare provider and to report any adverse effects and or reactions from your medicines to their outpatient provider promptly.  The patient is also instructed and cautioned to not engage in alcohol and or illegal drug use while on prescription medicines.  In the event of worsening symptoms the patient is instructed to call the crisis hotline, 911 and or go to the nearest ED for appropriate evaluation and treatment of symptoms.   Also while a patient in this hospital, the patient received medication management for his psychiatric symptoms. Patient detoxed without complications. She was started on Seroquel and Neurontin for mood swings and anxiety which she responded to without side effects.They were ordered and received as outlined below:    Medication List     As of 04/11/2012  1:17 PM    STOP taking these medications  clonazePAM 1 MG tablet   Commonly known as: KLONOPIN      TAKE these medications      Indication    gabapentin 100 MG capsule   Commonly known as: NEURONTIN   Take 1 capsule (100 mg total) by mouth 3 (three) times daily. For anxiety       QUEtiapine 100 MG tablet   Commonly known as: SEROQUEL   Take 1 tablet (100 mg total) by mouth 2 (two) times daily. For mood swings       traZODone 100 MG tablet   Commonly known as: DESYREL   Take 1 tablet (100 mg total)  by mouth at bedtime and may repeat dose one time if needed. For sleep        They were also enrolled in group counseling sessions and activities in which they participated actively.       Follow-up Information    Follow up with [facility] . On 04/11/2012. (arrive at 8:00a.m promptly)    Contact information:   [address]  [place], [place] [address]  [phone]         Upon discharge, patient adamantly denies suicidal, homicidal ideations, auditory, visual hallucinations and or delusional thinking. They left [facility] with all personal belongings via personal transportation in no apparent distress.  Consults:  Please see electronic medical record for details.  Significant Diagnostic Studies:  Please see electronic medical record for details.  Discharge Vitals:   Blood pressure 93/70, pulse 92, temperature 96.3 F (35.7 C), temperature source Oral, resp. rate 16, height 5\' 5"  (1.651 m), weight 57.153 kg (126 lb), last menstrual period 03/29/2012..  Mental Status Exam: See Mental Status Examination and Suicide Risk Assessment completed by Attending Physician prior to discharge.  Discharge destination:  [facility]  Is patient on multiple antipsychotic therapies at discharge:  No  Has Patient had three or more failed trials of antipsychotic monotherapy by history: N/A Recommended Plan for Multiple Antipsychotic Therapies: N/A Discharge Orders    Future Orders Please Complete By Expires   Diet - low sodium heart healthy      Increase activity slowly          Medication List     As of 04/11/2012  1:17 PM    STOP taking these medications         clonazePAM 1 MG tablet   Commonly known as: KLONOPIN      TAKE these medications      Indication    gabapentin 100 MG capsule   Commonly known as: NEURONTIN   Take 1 capsule (100 mg total) by mouth 3 (three) times daily. For anxiety       QUEtiapine 100 MG tablet   Commonly known as: SEROQUEL   Take 1 tablet  (100 mg total) by mouth 2 (two) times daily. For mood swings       traZODone 100 MG tablet   Commonly known as: DESYREL   Take 1 tablet (100 mg total) by mouth at bedtime and may repeat dose one time if needed. For sleep            Follow-up Information    Follow up with [facility] . On 04/11/2012. (arrive at 8:00a.m promptly)    Contact information:   [address]  [place], [place] [address]  [phone]        Follow-up recommendations:   Activities: Resume typical activities Diet: Resume typical diet Other: Follow up with outpatient provider and  report any side effects to out patient prescriber.  Comments:  Take all your medications as prescribed by your mental healthcare provider. Report any adverse effects and or reactions from your medicines to your outpatient provider promptly. Patient is instructed and cautioned to not engage in alcohol and or illegal drug use while on prescription medicines. In the event of worsening symptoms, patient is instructed to call the crisis hotline, 911 and or go to the nearest ED for appropriate evaluation and treatment of symptoms. Follow-up with your primary care provider for your other medical issues, concerns and or health care needs.  SignedDaleen Bo, [employer] 04/11/2012 1:17 PM

## 2012-04-11 NOTE — Progress Notes (Signed)
Patient ID: Karen Salas, female   DOB: 13-Sep-1985, 26 y.o.   MRN: [id number] Patient denies SI/HI and A/V hallucinations; patient received samples and her prescriptions;patient received copy of AVS after it was reviewed with her; patient did not have any belongings in her locker; patient signed that she had no belongings in a locker; patient left ambulatory and was met in the lobby by her ride; patient is aware that she has to be at her appointment at [facility] by 0800 this morning on 04/11/12

## 2012-04-11 NOTE — Tx Team (Signed)
Interdisciplinary Treatment Plan Update (Adult)  Date:  04/11/2012  Time Reviewed:  9:51 AM   Progress in Treatment: Attending groups: Yes Participating in groups:  Yes Taking medication as prescribed: Yes Tolerating medication:  Yes Family/Significant othe contact made:  No Patient understands diagnosis:  Yes Discussing patient identified problems/goals with staff:  Yes Medical problems stabilized or resolved:  Yes Denies suicidal/homicidal ideation: Yes Issues/concerns per patient self-inventory:  None identified Other: N/A  New problem(s) identified: None Identified  Reason for Continuation of Hospitalization: Stable to d/c  Interventions implemented related to continuation of hospitalization: Stable to d/c  Additional comments: N/A  Estimated length of stay: D/C today  Discharge Plan: Karen Salas will follow up with [facility] for further treatment.    New goal(s): N/A  Review of initial/current patient goals per problem list:    1.  Goal(s): Address substance use  Met:  Yes  Target date: by discharge  As evidenced by: completed detox protocol and referred to appropriate treatment  2.  Goal (s): Reduce depressive and anxiety symptoms  Met:  Yes  Target date: by discharge  As evidenced by: Reducing depression from a 10 to a 3 as reported by Karen Salas.     Attendees: Patient:  Karen Salas already d/c 04/11/2012 9:51 AM   Family:     Physician:  Patrick North, MD 04/11/2012 9:51 AM   Nursing: Edwyna Shell, RN 04/11/2012 9:51 AM   Case Manager:  Reyes Ivan, LCSWA 04/11/2012 9:51 AM   Counselor:  Ronda Fairly, LCSWA 04/11/2012 9:51 AM   Other:  Leighton Parody, RN 04/11/2012 9:51 AM   Other:  Jorje Guild, PA 04/11/2012 9:52 AM   Other:     Other:      Scribe for Treatment Team:   Reyes Ivan 04/11/2012 9:51 AM

## 2012-04-12 NOTE — Progress Notes (Signed)
Patient Discharge Instructions:  After Visit Summary (AVS):   Faxed to:  04/12/2012 Discharge Summary Note:   Faxed to:  04/12/2012 Psychiatric Admission Assessment Note:   Faxed to:  04/12/2012 Suicide Risk Assessment - Discharge Assessment:   Faxed to:  04/12/2012 Faxed/Sent to the Next Level Care provider:  04/12/2012 . Faxed to [facility] in [place] @ [phone]  Karleen Hampshire Brittini, 04/12/2012, 2:57 PM

## 2012-05-02 ENCOUNTER — Emergency Department (HOSPITAL_BASED_OUTPATIENT_CLINIC_OR_DEPARTMENT_OTHER)
Admission: EM | Admit: 2012-05-02 | Discharge: 2012-05-02 | Disposition: A | Discharge status: Home / Self Care | Payer: Medicaid Other | Attending: Emergency Medicine | Admitting: Emergency Medicine

## 2012-05-02 ENCOUNTER — Encounter (HOSPITAL_BASED_OUTPATIENT_CLINIC_OR_DEPARTMENT_OTHER): Payer: Self-pay | Admitting: *Deleted

## 2012-05-02 ENCOUNTER — Emergency Department (HOSPITAL_BASED_OUTPATIENT_CLINIC_OR_DEPARTMENT_OTHER): Payer: Medicaid Other

## 2012-05-02 DIAGNOSIS — R51 Headache: Secondary | ICD-10-CM

## 2012-05-02 DIAGNOSIS — F411 Generalized anxiety disorder: Secondary | ICD-10-CM | POA: Insufficient documentation

## 2012-05-02 DIAGNOSIS — J029 Acute pharyngitis, unspecified: Secondary | ICD-10-CM | POA: Insufficient documentation

## 2012-05-02 DIAGNOSIS — E049 Nontoxic goiter, unspecified: Secondary | ICD-10-CM | POA: Insufficient documentation

## 2012-05-02 DIAGNOSIS — F3289 Other specified depressive episodes: Secondary | ICD-10-CM | POA: Insufficient documentation

## 2012-05-02 DIAGNOSIS — F329 Major depressive disorder, single episode, unspecified: Secondary | ICD-10-CM | POA: Insufficient documentation

## 2012-05-02 DIAGNOSIS — F17200 Nicotine dependence, unspecified, uncomplicated: Secondary | ICD-10-CM | POA: Insufficient documentation

## 2012-05-02 LAB — CBC WITH DIFFERENTIAL/PLATELET
Eosinophils Absolute: 0.3 10*3/uL (ref 0.0–0.7)
Eosinophils Relative: 3 % (ref 0–5)
Hemoglobin: 12.1 g/dL (ref 12.0–15.0)
Lymphocytes Relative: 20 % (ref 12–46)
Lymphs Abs: 2.1 10*3/uL (ref 0.7–4.0)
MCH: 32 pg (ref 26.0–34.0)
MCV: 96 fL (ref 78.0–100.0)
Monocytes Relative: 10 % (ref 3–12)
RBC: 3.78 MIL/uL — ABNORMAL LOW (ref 3.87–5.11)

## 2012-05-02 LAB — BASIC METABOLIC PANEL
BUN: 10 mg/dL (ref 6–23)
CO2: 25 mEq/L (ref 19–32)
Calcium: 9.3 mg/dL (ref 8.4–10.5)
GFR calc non Af Amer: >90 mL/min (ref >90)
Glucose, Bld: 87 mg/dL (ref 70–99)
Potassium: 4.2 mEq/L (ref 3.5–5.1)

## 2012-05-02 LAB — RAPID URINE DRUG SCREEN, HOSP PERFORMED
Amphetamines: NOT DETECTED
Benzodiazepines: NOT DETECTED
Opiates: NOT DETECTED
Tetrahydrocannabinol: NOT DETECTED

## 2012-05-02 MED ORDER — ONDANSETRON HCL 4 MG/2ML IJ SOLN
4.0000 mg | Freq: Once | INTRAMUSCULAR | Status: AC
  Administered 2012-05-02: 4 mg via INTRAVENOUS
  Filled 2012-05-02: qty 2

## 2012-05-02 MED ORDER — DIPHENHYDRAMINE HCL 50 MG/ML IJ SOLN
25.0000 mg | Freq: Once | INTRAMUSCULAR | Status: AC
  Administered 2012-05-02: 25 mg via INTRAVENOUS
  Filled 2012-05-02: qty 1

## 2012-05-02 MED ORDER — TRAMADOL HCL 50 MG PO TABS: 50.0000 mg | ORAL_TABLET | Freq: Four times a day (QID) | ORAL | Status: DC | PRN

## 2012-05-02 MED ORDER — DEXAMETHASONE SODIUM PHOSPHATE 10 MG/ML IJ SOLN
10.0000 mg | Freq: Once | INTRAMUSCULAR | Status: AC
  Administered 2012-05-02: 10 mg via INTRAVENOUS
  Filled 2012-05-02: qty 1

## 2012-05-02 MED ORDER — PROMETHAZINE HCL 25 MG/ML IJ SOLN
12.5000 mg | Freq: Once | INTRAMUSCULAR | Status: AC
  Administered 2012-05-02: 12.5 mg via INTRAVENOUS
  Filled 2012-05-02: qty 1

## 2012-05-02 MED ORDER — SODIUM CHLORIDE 0.9 % IV SOLN: INTRAVENOUS | Status: DC

## 2012-05-02 MED ORDER — SODIUM CHLORIDE 0.9 % IV BOLUS (SEPSIS)
1000.0000 mL | Freq: Once | INTRAVENOUS | Status: AC
  Administered 2012-05-02: 1000 mL via INTRAVENOUS

## 2012-05-02 MED ORDER — HYDROMORPHONE HCL PF 1 MG/ML IJ SOLN: 1.0000 mg | Freq: Once | INTRAMUSCULAR | Status: DC

## 2012-05-02 NOTE — ED Notes (Signed)
Patients states she has had a sore throat, headache and bodyaches for one week.  Not sure about a fever due to taking so much ibuprofen.  Tonsils are red and swollen.  Took ibuprofen 30 min pta.

## 2012-05-02 NOTE — ED Provider Notes (Addendum)
History     CSN: [id number]  Arrival date & time 05/02/12  6578   First MD Initiated Contact with Patient 05/02/12 [id number]      Chief Complaint  Patient presents with  . Sore Throat    (Consider location/radiation/quality/duration/timing/severity/associated sxs/prior treatment) Patient is a 26 y.o. female presenting with pharyngitis. The history is provided by the patient.  Sore Throat Associated symptoms include headaches. Pertinent negatives include no chest pain, no abdominal pain and no shortness of breath.   Patient with 2 complaints. Brought in by [facility]. She is there for cocaine abuse. Patient complains of a headache for 2 weeks and a sore throat for one week. No nasal congestion associated with bodyaches no fever no cough. The headache is all over the head and constant for the past 2 weeks described as a 10 out of 10. The sore throat is a burning soreness is also described as a 10 out of 10. Past Medical History  Diagnosis Date  . Anxiety   . Mental disorder   . Depression     Past Surgical History  Procedure Date  . Wisdom tooth extraction     No family history on file.  History  Substance Use Topics  . Smoking status: Current Every Day Smoker -- 1.0 packs/day for 10 years  . Smokeless tobacco: Not on file  . Alcohol Use: No     denied alcohol use    OB History    Grav Para Term Preterm Abortions TAB SAB Ect Mult Living                  Review of Systems  Constitutional: Negative for fever.  HENT: Positive for sore throat. Negative for congestion.   Eyes: Negative for redness.  Respiratory: Negative for cough and shortness of breath.   Cardiovascular: Negative for chest pain.  Gastrointestinal: Negative for nausea, vomiting and abdominal pain.  Genitourinary: Negative for dysuria.  Musculoskeletal: Positive for myalgias.  Skin: Negative for rash.  Neurological: Positive for headaches.    Allergies  Keflet  Home Medications   Current  Outpatient Rx  Name Route Sig Dispense Refill  . GABAPENTIN 100 MG PO CAPS Oral Take 1 capsule (100 mg total) by mouth 3 (three) times daily. For anxiety 90 capsule 0  . QUETIAPINE FUMARATE 100 MG PO TABS Oral Take 1 tablet (100 mg total) by mouth 2 (two) times daily. For mood swings 60 tablet 0  . TRAMADOL HCL 50 MG PO TABS Oral Take 1 tablet (50 mg total) by mouth every 6 (six) hours as needed for pain. 15 tablet 0  . TRAZODONE HCL 100 MG PO TABS Oral Take 1 tablet (100 mg total) by mouth at bedtime and may repeat dose one time if needed. For sleep 30 tablet 0    BP 103/54  Pulse 64  Temp 97.9 F (36.6 C) (Oral)  Resp 16  Ht 5\' 5"  (1.651 m)  Wt 140 lb (63.504 kg)  BMI 23.30 kg/m2  SpO2 100%  LMP 04/11/2012  Physical Exam  Nursing note and vitals reviewed. Constitutional: She is oriented to person, place, and time. She appears well-developed and well-nourished.  HENT:  Head: Normocephalic and atraumatic.  Mouth/Throat: Oropharynx is clear and moist. No oropharyngeal exudate.  Eyes: Conjunctivae normal and EOM are normal. Pupils are equal, round, and reactive to light.  Neck: Normal range of motion. Thyromegaly present.  Cardiovascular: Normal rate, regular rhythm and normal heart sounds.   No murmur heard. Pulmonary/Chest:  Effort normal and breath sounds normal.  Abdominal: Soft. Bowel sounds are normal. There is no tenderness.  Musculoskeletal: Normal range of motion.  Lymphadenopathy:    She has no cervical adenopathy.  Neurological: She is alert and oriented to person, place, and time. No cranial nerve deficit. She exhibits normal muscle tone. Coordination normal.  Skin: Skin is warm. No rash noted.    ED Course  Procedures (including critical care time)  Labs Reviewed  CBC WITH DIFFERENTIAL - Abnormal; Notable for the following:    WBC 10.7 (*)     RBC 3.78 (*)     Monocytes Absolute 1.1 (*)     All other components within normal limits  RAPID STREP SCREEN  BASIC  METABOLIC PANEL  PREGNANCY, URINE  MONONUCLEOSIS SCREEN  URINE RAPID DRUG SCREEN (HOSP PERFORMED)   Ct Head Wo Contrast  05/02/2012  *RADIOLOGY REPORT*  Clinical Data: Headache.  CT HEAD WITHOUT CONTRAST  Technique:  Contiguous axial images were obtained from the base of the skull through the vertex without contrast.  Comparison: September 28, 2004.  Findings: Bony calvarium is intact.  No mass effect or midline shift is noted.  Ventricular size is within normal limits.  There is no evidence of mass lesion, hemorrhage or infarction.  IMPRESSION: No gross intracranial abnormality seen.   Original Report Authenticated By: Venita Sheffield., M.D.      1. Pharyngitis   2. Headache       MDM   Patient with extensive workup for 2 separate complaints. One is a sore throat and bodyaches has been present for 1 week. And a headache as well going on for 2 weeks. Patient is from [facility] [place]. She is therefore substance abuse problems. Workup here in the emergency Department strep test is negative Monospot test is negative head CT was negative migraine cocktail was provided even though she has no history of migraines which can be given narcotics some improvement in the headache patient says didn't help much. No evidence of any acute intracranial problems. No fever symptoms may be viral in nature. We'll discharge back to [facility].        Shelda Jakes, MD 05/02/12 1231  Shelda Jakes, MD 05/02/12 [id number]

## 2012-07-04 ENCOUNTER — Emergency Department (HOSPITAL_COMMUNITY): Payer: No Typology Code available for payment source

## 2012-07-04 ENCOUNTER — Encounter (HOSPITAL_COMMUNITY): Payer: Self-pay | Admitting: *Deleted

## 2012-07-04 ENCOUNTER — Emergency Department (HOSPITAL_COMMUNITY)
Admission: EM | Admit: 2012-07-04 | Discharge: 2012-07-04 | Disposition: A | Discharge status: Home / Self Care | Payer: No Typology Code available for payment source | Attending: Emergency Medicine | Admitting: Emergency Medicine

## 2012-07-04 DIAGNOSIS — F411 Generalized anxiety disorder: Secondary | ICD-10-CM | POA: Insufficient documentation

## 2012-07-04 DIAGNOSIS — S6990XA Unspecified injury of unspecified wrist, hand and finger(s), initial encounter: Secondary | ICD-10-CM | POA: Insufficient documentation

## 2012-07-04 DIAGNOSIS — T7421XA Adult sexual abuse, confirmed, initial encounter: Secondary | ICD-10-CM | POA: Insufficient documentation

## 2012-07-04 DIAGNOSIS — F17200 Nicotine dependence, unspecified, uncomplicated: Secondary | ICD-10-CM | POA: Insufficient documentation

## 2012-07-04 DIAGNOSIS — Z79899 Other long term (current) drug therapy: Secondary | ICD-10-CM | POA: Insufficient documentation

## 2012-07-04 DIAGNOSIS — S6980XA Other specified injuries of unspecified wrist, hand and finger(s), initial encounter: Secondary | ICD-10-CM | POA: Insufficient documentation

## 2012-07-04 DIAGNOSIS — Z8659 Personal history of other mental and behavioral disorders: Secondary | ICD-10-CM | POA: Insufficient documentation

## 2012-07-04 MED ORDER — AZITHROMYCIN 1 G PO PACK
PACK | ORAL | Status: AC
  Administered 2012-07-04: 1 g
  Filled 2012-07-04: qty 1

## 2012-07-04 MED ORDER — LEVONORGESTREL 0.75 MG PO TABS
ORAL_TABLET | ORAL | Status: AC
  Administered 2012-07-04: 15:00:00
  Filled 2012-07-04: qty 2

## 2012-07-04 MED ORDER — CEFIXIME 400 MG PO TABS
ORAL_TABLET | ORAL | Status: AC
  Administered 2012-07-04: 400 mg
  Filled 2012-07-04: qty 1

## 2012-07-04 MED ORDER — PROMETHAZINE HCL 25 MG PO TABS
ORAL_TABLET | ORAL | Status: AC
  Filled 2012-07-04: qty 3

## 2012-07-04 MED ORDER — CLONAZEPAM 0.5 MG PO TABS
1.0000 mg | ORAL_TABLET | Freq: Once | ORAL | Status: AC
  Administered 2012-07-04: 1 mg via ORAL
  Filled 2012-07-04: qty 2

## 2012-07-04 MED ORDER — ACETAMINOPHEN 325 MG PO TABS
650.0000 mg | ORAL_TABLET | Freq: Once | ORAL | Status: AC
  Administered 2012-07-04: 650 mg via ORAL
  Filled 2012-07-04: qty 2

## 2012-07-04 MED ORDER — METRONIDAZOLE 500 MG PO TABS
ORAL_TABLET | ORAL | Status: AC
  Administered 2012-07-04: 2000 mg
  Filled 2012-07-04: qty 4
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
## 2012-07-04 NOTE — ED Notes (Signed)
SANE RN at bedside to see patient.

## 2012-07-04 NOTE — ED Notes (Signed)
[employer] in room with pt at this time. Pt restless and speaking with a slow slurred speech, does not smell of ETOH

## 2012-07-04 NOTE — ED Notes (Signed)
Laurell Josephs, SANE RN, took patient for SANE evaluation.   Care transferred and report given to same.

## 2012-07-04 NOTE — ED Notes (Signed)
Patient denies alcohol use, but claims "i smoked crack last night".

## 2012-07-05 NOTE — ED Provider Notes (Signed)
Medical screening examination/treatment/procedure(s) were performed by non-physician practitioner and as supervising physician I was immediately available for consultation/collaboration.  Kru Allman, MD 07/05/12 0439 

## 2012-07-05 NOTE — L&D Delivery Note (Signed)
Delivery Note At 10:27 PM a viable female was delivered via Vaginal, Spontaneous Delivery (Presentation: Vertex; Occiput Anterior).  APGAR: 8, 9; weight 7 lb 9.9 oz (3456 g).   Placenta status: Intact, Spontaneous.  Cord: 3 vessels with the following complications: None.  Cord pH: none  Anesthesia: Epidural  Episiotomy: None Lacerations: None Suture Repair: none Est. Blood Loss (mL): 300  Mom to postpartum.  Baby to nursery-stable.  Catherina Pates A 03/22/2013, 11:16 PM

## 2012-10-03 LAB — OB RESULTS CONSOLE RPR: RPR: NONREACTIVE

## 2012-10-03 LAB — OB RESULTS CONSOLE ANTIBODY SCREEN: Antibody Screen: NEGATIVE

## 2012-10-03 LAB — OB RESULTS CONSOLE GC/CHLAMYDIA
Chlamydia: NEGATIVE
Gonorrhea: NEGATIVE

## 2013-02-12 LAB — OB RESULTS CONSOLE GC/CHLAMYDIA: Chlamydia: NEGATIVE

## 2013-02-12 LAB — OB RESULTS CONSOLE GBS: GBS: NEGATIVE

## 2013-03-20 ENCOUNTER — Telehealth (HOSPITAL_COMMUNITY): Payer: Self-pay | Admitting: *Deleted

## 2013-03-20 ENCOUNTER — Encounter (HOSPITAL_COMMUNITY): Payer: Self-pay | Admitting: *Deleted

## 2013-03-20 NOTE — Telephone Encounter (Signed)
Preadmission screen  

## 2013-03-22 ENCOUNTER — Inpatient Hospital Stay (HOSPITAL_COMMUNITY): Payer: Medicaid Other | Admitting: Anesthesiology

## 2013-03-22 ENCOUNTER — Encounter (HOSPITAL_COMMUNITY): Payer: Self-pay | Admitting: Anesthesiology

## 2013-03-22 ENCOUNTER — Encounter (HOSPITAL_COMMUNITY): Payer: Self-pay | Admitting: *Deleted

## 2013-03-22 ENCOUNTER — Inpatient Hospital Stay (HOSPITAL_COMMUNITY)
Admission: AD | Admit: 2013-03-22 | Discharge: 2013-03-24 | DRG: 775 | Disposition: A | Discharge status: Home / Self Care | Payer: Medicaid Other | Source: Ambulatory Visit | Attending: Obstetrics | Admitting: Obstetrics

## 2013-03-22 LAB — POCT FERN TEST: POCT Fern Test: POSITIVE

## 2013-03-22 LAB — CBC
HCT: 31.2 % — ABNORMAL LOW (ref 36.0–46.0)
MCHC: 34 g/dL (ref 30.0–36.0)
RDW: 14.5 % (ref 11.5–15.5)

## 2013-03-22 MED ORDER — PHENYLEPHRINE 40 MCG/ML (10ML) SYRINGE FOR IV PUSH (FOR BLOOD PRESSURE SUPPORT)
80.0000 ug | PREFILLED_SYRINGE | INTRAVENOUS | Status: DC | PRN
  Filled 2013-03-22: qty 2
  Filled 2013-03-22: qty 5

## 2013-03-22 MED ORDER — EPHEDRINE 5 MG/ML INJ
10.0000 mg | INTRAVENOUS | Status: DC | PRN
  Filled 2013-03-22: qty 2
  Filled 2013-03-22: qty 4

## 2013-03-22 MED ORDER — ONDANSETRON HCL 4 MG/2ML IJ SOLN: 4.0000 mg | Freq: Four times a day (QID) | INTRAMUSCULAR | Status: DC | PRN

## 2013-03-22 MED ORDER — CITRIC ACID-SODIUM CITRATE 334-500 MG/5ML PO SOLN: 30.0000 mL | ORAL | Status: DC | PRN

## 2013-03-22 MED ORDER — LIDOCAINE HCL (PF) 1 % IJ SOLN
INTRAMUSCULAR | Status: DC | PRN
  Administered 2013-03-22 (×2): 5 mL

## 2013-03-22 MED ORDER — FLEET ENEMA 7-19 GM/118ML RE ENEM: 1.0000 | ENEMA | RECTAL | Status: DC | PRN

## 2013-03-22 MED ORDER — IBUPROFEN 600 MG PO TABS
600.0000 mg | ORAL_TABLET | Freq: Four times a day (QID) | ORAL | Status: DC | PRN
  Administered 2013-03-22: 600 mg via ORAL
  Filled 2013-03-22: qty 1

## 2013-03-22 MED ORDER — LACTATED RINGERS IV SOLN
INTRAVENOUS | Status: DC
  Administered 2013-03-22 (×3): via INTRAVENOUS

## 2013-03-22 MED ORDER — PHENYLEPHRINE 40 MCG/ML (10ML) SYRINGE FOR IV PUSH (FOR BLOOD PRESSURE SUPPORT)
80.0000 ug | PREFILLED_SYRINGE | INTRAVENOUS | Status: DC | PRN
  Filled 2013-03-22: qty 2

## 2013-03-22 MED ORDER — LACTATED RINGERS IV SOLN: 500.0000 mL | Freq: Once | INTRAVENOUS | Status: DC

## 2013-03-22 MED ORDER — LACTATED RINGERS IV SOLN
500.0000 mL | INTRAVENOUS | Status: DC | PRN
  Administered 2013-03-22: 500 mL via INTRAVENOUS
  Administered 2013-03-22: 1000 mL via INTRAVENOUS

## 2013-03-22 MED ORDER — DIPHENHYDRAMINE HCL 50 MG/ML IJ SOLN: 12.5000 mg | INTRAMUSCULAR | Status: DC | PRN

## 2013-03-22 MED ORDER — BUTORPHANOL TARTRATE 1 MG/ML IJ SOLN
1.0000 mg | INTRAMUSCULAR | Status: DC | PRN
  Administered 2013-03-22 (×2): 1 mg via INTRAVENOUS
  Filled 2013-03-22 (×2): qty 1

## 2013-03-22 MED ORDER — OXYTOCIN BOLUS FROM INFUSION
500.0000 mL | INTRAVENOUS | Status: DC
  Administered 2013-03-22: 500 mL via INTRAVENOUS

## 2013-03-22 MED ORDER — FENTANYL 2.5 MCG/ML BUPIVACAINE 1/10 % EPIDURAL INFUSION (WH - ANES)
14.0000 mL/h | INTRAMUSCULAR | Status: DC | PRN
  Administered 2013-03-22: 14 mL/h via EPIDURAL
  Filled 2013-03-22: qty 125

## 2013-03-22 MED ORDER — EPHEDRINE 5 MG/ML INJ
10.0000 mg | INTRAVENOUS | Status: DC | PRN
  Filled 2013-03-22: qty 2

## 2013-03-22 MED ORDER — OXYTOCIN 40 UNITS IN LACTATED RINGERS INFUSION - SIMPLE MED
1.0000 m[IU]/min | INTRAVENOUS | Status: DC
  Administered 2013-03-22: 2 m[IU]/min via INTRAVENOUS

## 2013-03-22 MED ORDER — LIDOCAINE HCL (PF) 1 % IJ SOLN
30.0000 mL | INTRAMUSCULAR | Status: DC | PRN
  Filled 2013-03-22 (×2): qty 30

## 2013-03-22 MED ORDER — OXYCODONE-ACETAMINOPHEN 5-325 MG PO TABS: 1.0000 | ORAL_TABLET | ORAL | Status: DC | PRN

## 2013-03-22 MED ORDER — ACETAMINOPHEN 325 MG PO TABS
650.0000 mg | ORAL_TABLET | ORAL | Status: DC | PRN
  Administered 2013-03-22: 650 mg via ORAL
  Filled 2013-03-22: qty 2

## 2013-03-22 MED ORDER — OXYTOCIN 40 UNITS IN LACTATED RINGERS INFUSION - SIMPLE MED
62.5000 mL/h | INTRAVENOUS | Status: DC
  Administered 2013-03-22: 62.5 mL/h via INTRAVENOUS
  Filled 2013-03-22 (×2): qty 1000

## 2013-03-22 MED ORDER — TERBUTALINE SULFATE 1 MG/ML IJ SOLN: 0.2500 mg | Freq: Once | INTRAMUSCULAR | Status: AC | PRN

## 2013-03-22 MED ORDER — OXYTOCIN 40 UNITS IN LACTATED RINGERS INFUSION - SIMPLE MED: 1.0000 m[IU]/min | INTRAVENOUS | Status: DC

## 2013-03-22 NOTE — Anesthesia Procedure Notes (Signed)
Epidural Patient location during procedure: OB Start time: 03/22/2013 6:28 PM  Staffing Anesthesiologist: Angus Seller., Harrell Gave. Performed by: anesthesiologist   Preanesthetic Checklist Completed: patient identified, site marked, surgical consent, pre-op evaluation, timeout performed, IV checked, risks and benefits discussed and monitors and equipment checked  Epidural Patient position: sitting Prep: site prepped and draped and DuraPrep Patient monitoring: continuous pulse ox and blood pressure Approach: midline Injection technique: LOR air  Needle:  Needle type: Tuohy  Needle gauge: 17 G Needle length: 9 cm and 9 Needle insertion depth: 5 cm cm Catheter type: closed end flexible Catheter size: 19 Gauge Catheter at skin depth: 10 cm Test dose: negative  Assessment Events: blood not aspirated, injection not painful, no injection resistance, negative IV test and no paresthesia  Additional Notes Patient identified.  Risk benefits discussed including failed block, incomplete pain control, headache, nerve damage, paralysis, blood pressure changes, nausea, vomiting, reactions to medication both toxic or allergic, and postpartum back pain.  Patient expressed understanding and wished to proceed.  All questions were answered.  Sterile technique used throughout procedure and epidural site dressed with sterile barrier dressing. No paresthesia or other complications noted.The patient did not experience any signs of intravascular injection such as tinnitus or metallic taste in mouth nor signs of intrathecal spread such as rapid motor block. Please see nursing notes for vital signs.

## 2013-03-22 NOTE — Progress Notes (Signed)
Karen Salas is a 27 y.o. [phone] at [redacted]w[redacted]d by LMP admitted for rupture of membranes  Subjective:   Objective: BP 145/50  Pulse 122  Temp(Src) 100.1 F (37.8 C) (Oral)  Resp 20  Ht 5\' 6"  (1.676 m)  Wt 175 lb (79.379 kg)  BMI 28.26 kg/m2  SpO2 99%  LMP 06/28/2012   Total I/O In: -  Out: 300 [Blood:300]  FHT:  FHR: 140-150 bpm, variability: moderate,  accelerations:  Present,  decelerations:  Absent UC:   regular, every 3 minutes SVE:   Dilation: 10 Effacement (%): 100 Station: +2 Exam by:: A.Davis,RN  Labs: Lab Results  Component Value Date   WBC 15.7* 03/22/2013   HGB 10.6* 03/22/2013   HCT 31.2* 03/22/2013   MCV 96.0 03/22/2013   PLT 399 03/22/2013    Assessment / Plan: Augmentation of labor, progressing well  Labor: Progressing normally Preeclampsia:  n/a Fetal Wellbeing:  Category I Pain Control:  Epidural I/D:  n/a Anticipated MOD:  NSVD  Karen Salas A 03/22/2013, 11:14 PM

## 2013-03-22 NOTE — MAU Note (Signed)
Patient presents to MAU with LOF since 1000am and contractions. Denies bleeding. Reports good fetal movement.

## 2013-03-22 NOTE — H&P (Signed)
This is Dr. Francoise Ceo dictating the history and physical on blank blank she's a 27 year old gravida 3 para 2002 at 40 weeks and 2 days due date 03/20/2013 negative GBS in for induction her cervix is 2 cm 80% vertex -2 and her cervix ruptured spontaneously at 9 AM today she is having irregular contractions Past medical history negative Past surgical history negative Social history negative Physical system review negative Physical exam well-developed female in with ruptured membranes not in labor HEENT negative Lungs clear to P&A Breasts negative Heart regular rhythm no murmurs no gallops Abdomen term Pelvic as described above Extremities negative

## 2013-03-22 NOTE — Anesthesia Preprocedure Evaluation (Signed)
Anesthesia Evaluation  Patient identified by MRN, date of birth, ID band Patient awake    Reviewed: Allergy & Precautions, H&P , Patient's Chart, lab work & pertinent test results  Airway Mallampati: II TM Distance: >3 FB Neck ROM: full    Dental no notable dental hx.    Pulmonary neg pulmonary ROS,  breath sounds clear to auscultation  Pulmonary exam normal       Cardiovascular negative cardio ROS  Rhythm:regular Rate:Normal     Neuro/Psych negative neurological ROS  negative psych ROS   GI/Hepatic negative GI ROS, Neg liver ROS, (+) Hepatitis -  Endo/Other  negative endocrine ROS  Renal/GU negative Renal ROS     Musculoskeletal   Abdominal   Peds  Hematology negative hematology ROS (+)   Anesthesia Other Findings Anxiety     Mental disorder        Depression     Hx of chlamydia infection        Hx of gonorrhea     Hepatitis   has the antibody but not disease    Reproductive/Obstetrics (+) Pregnancy                           Anesthesia Physical Anesthesia Plan  ASA: III  Anesthesia Plan: Epidural   Post-op Pain Management:    Induction:   Airway Management Planned:   Additional Equipment:   Intra-op Plan:   Post-operative Plan:   Informed Consent: I have reviewed the patients History and Physical, chart, labs and discussed the procedure including the risks, benefits and alternatives for the proposed anesthesia with the patient or authorized representative who has indicated his/her understanding and acceptance.     Plan Discussed with:   Anesthesia Plan Comments:         Anesthesia Quick Evaluation

## 2013-03-23 ENCOUNTER — Encounter (HOSPITAL_COMMUNITY): Payer: Self-pay | Admitting: *Deleted

## 2013-03-23 LAB — CBC
HCT: 29.7 % — ABNORMAL LOW (ref 36.0–46.0)
Hemoglobin: 10 g/dL — ABNORMAL LOW (ref 12.0–15.0)
MCH: 32.4 pg (ref 26.0–34.0)
MCV: 96.1 fL (ref 78.0–100.0)
Platelets: 373 10*3/uL (ref 150–400)
RBC: 3.09 MIL/uL — ABNORMAL LOW (ref 3.87–5.11)
WBC: 29.5 10*3/uL — ABNORMAL HIGH (ref 4.0–10.5)

## 2013-03-23 MED ORDER — PNEUMOCOCCAL VAC POLYVALENT 25 MCG/0.5ML IJ INJ
0.5000 mL | INJECTION | INTRAMUSCULAR | Status: DC
  Filled 2013-03-23: qty 0.5

## 2013-03-23 MED ORDER — IBUPROFEN 600 MG PO TABS
600.0000 mg | ORAL_TABLET | Freq: Four times a day (QID) | ORAL | Status: DC
  Administered 2013-03-23 – 2013-03-24 (×5): 600 mg via ORAL
  Filled 2013-03-23 (×6): qty 1

## 2013-03-23 MED ORDER — MEDROXYPROGESTERONE ACETATE 150 MG/ML IM SUSP: 150.0000 mg | INTRAMUSCULAR | Status: DC | PRN

## 2013-03-23 MED ORDER — LANOLIN HYDROUS EX OINT: TOPICAL_OINTMENT | CUTANEOUS | Status: DC | PRN

## 2013-03-23 MED ORDER — INFLUENZA VAC SPLIT QUAD 0.5 ML IM SUSP
0.5000 mL | INTRAMUSCULAR | Status: AC
  Administered 2013-03-24: 0.5 mL via INTRAMUSCULAR
  Filled 2013-03-23: qty 0.5

## 2013-03-23 MED ORDER — PRENATAL MULTIVITAMIN CH
1.0000 | ORAL_TABLET | Freq: Every day | ORAL | Status: DC
  Administered 2013-03-23: 1 via ORAL
  Filled 2013-03-23 (×2): qty 1

## 2013-03-23 MED ORDER — WITCH HAZEL-GLYCERIN EX PADS: 1.0000 "application " | MEDICATED_PAD | CUTANEOUS | Status: DC | PRN

## 2013-03-23 MED ORDER — ONDANSETRON HCL 4 MG/2ML IJ SOLN: 4.0000 mg | INTRAMUSCULAR | Status: DC | PRN

## 2013-03-23 MED ORDER — SENNOSIDES-DOCUSATE SODIUM 8.6-50 MG PO TABS
2.0000 | ORAL_TABLET | ORAL | Status: DC
  Administered 2013-03-23 (×2): 2 via ORAL

## 2013-03-23 MED ORDER — SIMETHICONE 80 MG PO CHEW: 80.0000 mg | CHEWABLE_TABLET | ORAL | Status: DC | PRN

## 2013-03-23 MED ORDER — BENZOCAINE-MENTHOL 20-0.5 % EX AERO
1.0000 "application " | INHALATION_SPRAY | CUTANEOUS | Status: DC | PRN
  Administered 2013-03-23: 1 via TOPICAL
  Filled 2013-03-23: qty 56

## 2013-03-23 MED ORDER — DIBUCAINE 1 % RE OINT: 1.0000 "application " | TOPICAL_OINTMENT | RECTAL | Status: DC | PRN

## 2013-03-23 MED ORDER — DIPHENHYDRAMINE HCL 25 MG PO CAPS: 25.0000 mg | ORAL_CAPSULE | Freq: Four times a day (QID) | ORAL | Status: DC | PRN

## 2013-03-23 MED ORDER — OXYCODONE-ACETAMINOPHEN 5-325 MG PO TABS
1.0000 | ORAL_TABLET | ORAL | Status: DC | PRN
  Administered 2013-03-23: 1 via ORAL
  Administered 2013-03-23 (×2): 2 via ORAL
  Administered 2013-03-23 (×2): 1 via ORAL
  Administered 2013-03-24 (×3): 2 via ORAL
  Filled 2013-03-23 (×2): qty 2
  Filled 2013-03-23 (×2): qty 1
  Filled 2013-03-23 (×4): qty 2

## 2013-03-23 MED ORDER — ONDANSETRON HCL 4 MG PO TABS: 4.0000 mg | ORAL_TABLET | ORAL | Status: DC | PRN

## 2013-03-23 MED ORDER — ZOLPIDEM TARTRATE 5 MG PO TABS: 5.0000 mg | ORAL_TABLET | Freq: Every evening | ORAL | Status: DC | PRN

## 2013-03-23 MED ORDER — OXYTOCIN 40 UNITS IN LACTATED RINGERS INFUSION - SIMPLE MED: 62.5000 mL/h | INTRAVENOUS | Status: DC | PRN

## 2013-03-23 MED ORDER — TETANUS-DIPHTH-ACELL PERTUSSIS 5-2.5-18.5 LF-MCG/0.5 IM SUSP
0.5000 mL | Freq: Once | INTRAMUSCULAR | Status: AC
  Administered 2013-03-23: 0.5 mL via INTRAMUSCULAR
  Filled 2013-03-23: qty 0.5

## 2013-03-23 NOTE — Anesthesia Postprocedure Evaluation (Signed)
  Anesthesia Post-op Note  Patient: ANNELLA PROWELL  Procedure(s) Performed: Epidural   Patient Location: Mother /Baby  Anesthesia Type:Epidural  Level of Consciousness: Awake, Alert, Oriented  Airway and Oxygen Therapy: Patient Spontanous Breathing  Post-op Pain: none  Post-op Assessment: Post-op Vital signs reviewed  Post-op Vital Signs: Reviewed and stable  Complications: No apparent anesthesia complications
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
## 2013-03-23 NOTE — Progress Notes (Signed)
Patient ID: Karen Salas, female   DOB: 03/03/1986, 27 y.o.   MRN: [id number] Postpartum day one Vital signs normal Fundus firm Legs negative Doing well

## 2013-03-23 NOTE — Progress Notes (Signed)
UR chart review completed.  

## 2013-03-24 ENCOUNTER — Inpatient Hospital Stay (HOSPITAL_COMMUNITY): Admission: RE | Admit: 2013-03-24 | Payer: Medicaid Other | Source: Ambulatory Visit

## 2013-03-24 NOTE — Discharge Summary (Signed)
Obstetric Discharge Summary Reason for Admission: induction of labor Prenatal Procedures: none Intrapartum Procedures: spontaneous vaginal delivery Postpartum Procedures: none Complications-Operative and Postpartum: none Hemoglobin  Date Value Range Status  03/23/2013 10.0* 12.0 - 15.0 g/dL Final     HCT  Date Value Range Status  03/23/2013 29.7* 36.0 - 46.0 % Final    Physical Exam:  General: alert Lochia: appropriate Uterine Fundus: firm Incision: healing well DVT Evaluation: No evidence of DVT seen on physical exam.  Discharge Diagnoses: Term Pregnancy-delivered  Discharge Information: Date: 03/24/2013 Activity: pelvic rest Diet: routine Medications: Percocet Condition: stable Instructions: refer to practice specific booklet Discharge to: home Follow-up Information   Follow up with Kathreen Cosier, MD.   Specialty:  Obstetrics and Gynecology   Contact information:   [address] [place] [address] [phone]       Newborn Data: Live born female  Birth Weight: 7 lb 9.9 oz (3456 g) APGAR: 8, 9  Home with mother.  Jeidy Hoerner A 03/24/2013, 8:15 AM

## 2013-08-09 ENCOUNTER — Emergency Department (HOSPITAL_COMMUNITY)
Admission: EM | Admit: 2013-08-09 | Discharge: 2013-08-10 | Disposition: A | Discharge status: Home / Self Care | Payer: Medicaid Other | Attending: Emergency Medicine | Admitting: Emergency Medicine

## 2013-08-09 ENCOUNTER — Encounter (HOSPITAL_COMMUNITY): Payer: Self-pay | Admitting: Emergency Medicine

## 2013-08-09 DIAGNOSIS — R000 Tachycardia, unspecified: Secondary | ICD-10-CM | POA: Insufficient documentation

## 2013-08-09 DIAGNOSIS — R6889 Other general symptoms and signs: Secondary | ICD-10-CM

## 2013-08-09 DIAGNOSIS — Z8619 Personal history of other infectious and parasitic diseases: Secondary | ICD-10-CM | POA: Insufficient documentation

## 2013-08-09 DIAGNOSIS — F17200 Nicotine dependence, unspecified, uncomplicated: Secondary | ICD-10-CM | POA: Insufficient documentation

## 2013-08-09 DIAGNOSIS — Z79899 Other long term (current) drug therapy: Secondary | ICD-10-CM | POA: Insufficient documentation

## 2013-08-09 DIAGNOSIS — Z3202 Encounter for pregnancy test, result negative: Secondary | ICD-10-CM | POA: Insufficient documentation

## 2013-08-09 DIAGNOSIS — J111 Influenza due to unidentified influenza virus with other respiratory manifestations: Secondary | ICD-10-CM | POA: Insufficient documentation

## 2013-08-09 DIAGNOSIS — R112 Nausea with vomiting, unspecified: Secondary | ICD-10-CM | POA: Insufficient documentation

## 2013-08-09 DIAGNOSIS — Z8719 Personal history of other diseases of the digestive system: Secondary | ICD-10-CM | POA: Insufficient documentation

## 2013-08-09 DIAGNOSIS — F411 Generalized anxiety disorder: Secondary | ICD-10-CM | POA: Insufficient documentation

## 2013-08-09 LAB — COMPREHENSIVE METABOLIC PANEL
ALBUMIN: 4.2 g/dL (ref 3.5–5.2)
ALT: 13 U/L (ref 0–35)
AST: 19 U/L (ref 0–37)
Alkaline Phosphatase: 105 U/L (ref 39–117)
BUN: 8 mg/dL (ref 6–23)
CALCIUM: 9.5 mg/dL (ref 8.4–10.5)
CHLORIDE: 102 meq/L (ref 96–112)
CO2: 21 meq/L (ref 19–32)
Creatinine, Ser: 0.77 mg/dL (ref 0.50–1.10)
GFR calc Af Amer: >90 mL/min (ref >90)
Glucose, Bld: 89 mg/dL (ref 70–99)
Potassium: 3.8 mEq/L (ref 3.7–5.3)
SODIUM: 140 meq/L (ref 137–147)
Total Bilirubin: 0.5 mg/dL (ref 0.3–1.2)
Total Protein: 7.9 g/dL (ref 6.0–8.3)

## 2013-08-09 LAB — URINALYSIS, ROUTINE W REFLEX MICROSCOPIC
BILIRUBIN URINE: NEGATIVE
GLUCOSE, UA: NEGATIVE mg/dL
HGB URINE DIPSTICK: NEGATIVE
Ketones, ur: NEGATIVE mg/dL
Leukocytes, UA: NEGATIVE
Nitrite: NEGATIVE
Protein, ur: NEGATIVE mg/dL
SPECIFIC GRAVITY, URINE: 1.019 (ref 1.005–1.030)
UROBILINOGEN UA: 0.2 mg/dL (ref 0.0–1.0)
pH: 8.5 — ABNORMAL HIGH (ref 5.0–8.0)

## 2013-08-09 LAB — CBC WITH DIFFERENTIAL/PLATELET
BASOS ABS: 0 10*3/uL (ref 0.0–0.1)
BASOS PCT: 1 % (ref 0–1)
EOS PCT: 0 % (ref 0–5)
Eosinophils Absolute: 0 10*3/uL (ref 0.0–0.7)
HCT: 38.3 % (ref 36.0–46.0)
Hemoglobin: 13.5 g/dL (ref 12.0–15.0)
LYMPHS PCT: 24 % (ref 12–46)
Lymphs Abs: 0.7 10*3/uL (ref 0.7–4.0)
MCH: 32.5 pg (ref 26.0–34.0)
MCHC: 35.2 g/dL (ref 30.0–36.0)
MCV: 92.1 fL (ref 78.0–100.0)
Monocytes Absolute: 0.6 10*3/uL (ref 0.1–1.0)
Monocytes Relative: 21 % — ABNORMAL HIGH (ref 3–12)
NEUTROS ABS: 1.6 10*3/uL — AB (ref 1.7–7.7)
Neutrophils Relative %: 54 % (ref 43–77)
PLATELETS: 276 10*3/uL (ref 150–400)
RBC: 4.16 MIL/uL (ref 3.87–5.11)
RDW: 13.3 % (ref 11.5–15.5)
WBC: 3 10*3/uL — AB (ref 4.0–10.5)

## 2013-08-09 LAB — RAPID STREP SCREEN (MED CTR MEBANE ONLY): STREPTOCOCCUS, GROUP A SCREEN (DIRECT): NEGATIVE

## 2013-08-09 LAB — POCT PREGNANCY, URINE: PREG TEST UR: NEGATIVE

## 2013-08-09 MED ORDER — METOCLOPRAMIDE HCL 5 MG/ML IJ SOLN
5.0000 mg | Freq: Once | INTRAMUSCULAR | Status: AC
  Administered 2013-08-09: 5 mg via INTRAVENOUS
  Filled 2013-08-09: qty 2

## 2013-08-09 MED ORDER — ACETAMINOPHEN 325 MG PO TABS
650.0000 mg | ORAL_TABLET | Freq: Four times a day (QID) | ORAL | Status: DC | PRN
  Administered 2013-08-09: 650 mg via ORAL
  Filled 2013-08-09: qty 2

## 2013-08-09 MED ORDER — ONDANSETRON 4 MG PO TBDP
8.0000 mg | ORAL_TABLET | Freq: Once | ORAL | Status: AC
  Administered 2013-08-09: 8 mg via ORAL
  Filled 2013-08-09: qty 2

## 2013-08-09 MED ORDER — DIPHENHYDRAMINE HCL 50 MG/ML IJ SOLN
25.0000 mg | Freq: Once | INTRAMUSCULAR | Status: AC
  Administered 2013-08-09: 25 mg via INTRAVENOUS
  Filled 2013-08-09: qty 1

## 2013-08-09 MED ORDER — SODIUM CHLORIDE 0.9 % IV BOLUS (SEPSIS)
1000.0000 mL | Freq: Once | INTRAVENOUS | Status: AC
  Administered 2013-08-09: 1000 mL via INTRAVENOUS

## 2013-08-09 MED ORDER — KETOROLAC TROMETHAMINE 30 MG/ML IJ SOLN
30.0000 mg | Freq: Once | INTRAMUSCULAR | Status: AC
  Administered 2013-08-09: 30 mg via INTRAVENOUS
  Filled 2013-08-09: qty 1

## 2013-08-09 MED ORDER — IBUPROFEN 600 MG PO TABS: 600.0000 mg | ORAL_TABLET | Freq: Four times a day (QID) | ORAL | Status: DC | PRN

## 2013-08-09 NOTE — ED Notes (Signed)
PT c/o headache since Monday followed by body aches, sore throat and today, emesis.  Denies diarrhea, though does c/o abdominal pain.

## 2013-08-09 NOTE — Discharge Instructions (Signed)

## 2013-08-09 NOTE — ED Provider Notes (Signed)
CSN: [id number]     Arrival date & time 08/09/13  1545 History   First MD Initiated Contact with Patient 08/09/13 1948     Chief Complaint  Patient presents with  . flu-like s/s    (Consider location/radiation/quality/duration/timing/severity/associated sxs/prior Treatment) HPI  This a 28 year old female who presents with chills, fever, bodyaches, and sore throat. Patient reports onset of symptoms on Monday. Symptoms include headache, sore throat, body aches. Reports a fever to 102 this morning. Denies any neck stiffness but does endorse generalized myalgias. Has children who have been sick as well. One episode of nonbilious, nonbloody emesis today. She denies abdominal pain. Did receive flu shot this year.  Past Medical History  Diagnosis Date  . Anxiety   . Mental disorder   . Depression   . Hx of chlamydia infection   . Hx of gonorrhea   . Hepatitis     has the antibody but not disease   Past Surgical History  Procedure Laterality Date  . Wisdom tooth extraction     No family history on file. History  Substance Use Topics  . Smoking status: Current Every Day Smoker -- 1.00 packs/day for 10 years    Types: Cigarettes  . Smokeless tobacco: Not on file  . Alcohol Use: No     Comment: denied alcohol use   OB History   Grav Para Term Preterm Abortions TAB SAB Ect Mult Living   3 3 3       2      Review of Systems  Constitutional: Positive for fever and chills.  HENT: Positive for congestion and sore throat.   Respiratory: Positive for cough. Negative for chest tightness and shortness of breath.   Cardiovascular: Negative for chest pain.  Gastrointestinal: Positive for nausea and vomiting. Negative for abdominal pain and diarrhea.  Genitourinary: Negative for dysuria.  Musculoskeletal: Positive for myalgias. Negative for back pain and neck stiffness.  Skin: Negative for rash.  Neurological: Positive for headaches.  All other systems reviewed and are  negative.    Allergies  Cephalexin  Home Medications   Current Outpatient Rx  Name  Route  Sig  Dispense  Refill  . clonazePAM (KLONOPIN) 1 MG tablet   Oral   Take 1 mg by mouth 2 (two) times daily.         [name]. oxyCODONE-acetaminophen (PERCOCET/ROXICET) 5-325 MG per tablet   Oral   Take 1 tablet by mouth every 4 (four) hours as needed for pain.         [name]. ibuprofen (ADVIL,MOTRIN) 600 MG tablet   Oral   Take 1 tablet (600 mg total) by mouth every 6 (six) hours as needed.   30 tablet   0    BP 135/75  Pulse 91  Temp(Src) 99.5 F (37.5 C) (Oral)  Resp 18  Ht 5\' 6"  (1.676 m)  Wt 141 lb (63.957 kg)  BMI 22.77 kg/m2  SpO2 100%  LMP 08/02/2013 Physical Exam  Nursing note and vitals reviewed. Constitutional: She is oriented to person, place, and time. No distress.  Ill-appearing, no acute distress  HENT:  Head: Normocephalic and atraumatic.  Mouth/Throat: Oropharynx is clear and moist. No oropharyngeal exudate.  Eyes: Pupils are equal, round, and reactive to light.  Neck: Neck supple.  No meningismus  Cardiovascular: Regular rhythm and normal heart sounds.   Tachycardia  Pulmonary/Chest: Effort normal and breath sounds normal. No respiratory distress. She has no wheezes.  Abdominal: Soft. Bowel sounds are normal. There is no  tenderness.  Neurological: She is alert and oriented to person, place, and time.  Skin: Skin is warm and dry.  Psychiatric: She has a normal mood and affect.    ED Course  Procedures (including critical care time) Labs Review Labs Reviewed  CBC WITH DIFFERENTIAL - Abnormal; Notable for the following:    WBC 3.0 (*)    Neutro Abs 1.6 (*)    Monocytes Relative 21 (*)    All other components within normal limits  URINALYSIS, ROUTINE W REFLEX MICROSCOPIC - Abnormal; Notable for the following:    APPearance CLOUDY (*)    pH 8.5 (*)    All other components within normal limits  RAPID STREP SCREEN  CULTURE, GROUP A STREP  COMPREHENSIVE  METABOLIC PANEL  POCT PREGNANCY, URINE   Imaging Review No results found.  EKG Interpretation   None       MDM   1. Flu-like symptoms    Patient presents with flulike symptoms. Afebrile here. Does appear dry on exam. Patient was given normal saline bolus. Lab work including strep screen is largely unremarkable. Patient has no evidence of meningismus and have low suspicion at this time for meningitis. Patient reports improvement of headache and myalgias with a normal saline bolus. She's given Toradol. Have encouraged patient to use ibuprofen at home for treatment of myalgias. She's out of the window for Tamiflu.  After history, exam, and medical workup I feel the patient has been appropriately medically screened and is safe for discharge home. Pertinent diagnoses were discussed with the patient. Patient was given return precautions.     Shon Baton, MD 08/10/13 0000

## 2013-08-12 LAB — CULTURE, GROUP A STREP

## 2013-10-04 ENCOUNTER — Other Ambulatory Visit (HOSPITAL_COMMUNITY)
Admission: RE | Admit: 2013-10-04 | Discharge: 2013-10-04 | Disposition: A | Discharge status: Home / Self Care | Payer: Medicaid Other | Source: Ambulatory Visit | Attending: Family Medicine | Admitting: Family Medicine

## 2013-10-04 ENCOUNTER — Emergency Department (HOSPITAL_COMMUNITY)
Admission: EM | Admit: 2013-10-04 | Discharge: 2013-10-04 | Disposition: A | Discharge status: Home / Self Care | Payer: Medicaid Other | Source: Home / Self Care | Attending: Emergency Medicine | Admitting: Emergency Medicine

## 2013-10-04 ENCOUNTER — Encounter (HOSPITAL_COMMUNITY): Payer: Self-pay | Admitting: Emergency Medicine

## 2013-10-04 DIAGNOSIS — N390 Urinary tract infection, site not specified: Secondary | ICD-10-CM

## 2013-10-04 DIAGNOSIS — A499 Bacterial infection, unspecified: Secondary | ICD-10-CM

## 2013-10-04 DIAGNOSIS — Z113 Encounter for screening for infections with a predominantly sexual mode of transmission: Secondary | ICD-10-CM | POA: Insufficient documentation

## 2013-10-04 DIAGNOSIS — N898 Other specified noninflammatory disorders of vagina: Secondary | ICD-10-CM

## 2013-10-04 DIAGNOSIS — N760 Acute vaginitis: Secondary | ICD-10-CM

## 2013-10-04 DIAGNOSIS — B9689 Other specified bacterial agents as the cause of diseases classified elsewhere: Secondary | ICD-10-CM

## 2013-10-04 LAB — POCT URINALYSIS DIP (DEVICE)
Bilirubin Urine: NEGATIVE
Glucose, UA: NEGATIVE mg/dL
Ketones, ur: NEGATIVE mg/dL
Nitrite: POSITIVE — AB
PH: 6 (ref 5.0–8.0)
Protein, ur: 100 mg/dL — AB
Specific Gravity, Urine: >=1.03 (ref 1.005–1.030)
UROBILINOGEN UA: 0.2 mg/dL (ref 0.0–1.0)

## 2013-10-04 LAB — POCT PREGNANCY, URINE: PREG TEST UR: NEGATIVE

## 2013-10-04 MED ORDER — METRONIDAZOLE 500 MG PO TABS: 500.0000 mg | ORAL_TABLET | Freq: Two times a day (BID) | ORAL | Status: DC

## 2013-10-04 MED ORDER — FLUCONAZOLE 150 MG PO TABS: ORAL_TABLET | ORAL | Status: DC

## 2013-10-04 MED ORDER — CIPROFLOXACIN HCL 500 MG PO TABS: 500.0000 mg | ORAL_TABLET | Freq: Two times a day (BID) | ORAL | Status: DC

## 2013-10-04 NOTE — ED Notes (Signed)
Concerned for bv, history of the same.  Onset of symptoms 3/27.  Vaginal discharge and odor present per patient.  Patient reports she has been douching.

## 2013-10-04 NOTE — ED Provider Notes (Signed)
Medical screening examination/treatment/procedure(s) were performed by non-physician practitioner and as supervising physician I was immediately available for consultation/collaboration.  Shyra Emile, M.D.  Reyan Helle C Laporsha Grealish, MD 10/04/13 1905 

## 2013-10-04 NOTE — Discharge Instructions (Signed)
Bacterial Vaginosis °Bacterial vaginosis is a vaginal infection that occurs when the normal balance of bacteria in the vagina is disrupted. It results from an overgrowth of certain bacteria. This is the most common vaginal infection in women of childbearing age. Treatment is important to prevent complications, especially in pregnant women, as it can cause a premature delivery. °CAUSES  °Bacterial vaginosis is caused by an increase in harmful bacteria that are normally present in smaller amounts in the vagina. Several different kinds of bacteria can cause bacterial vaginosis. However, the reason that the condition develops is not fully understood. °RISK FACTORS °Certain activities or behaviors can put you at an increased risk of developing bacterial vaginosis, including: °· Having a new sex partner or multiple sex partners. °· Douching. °· Using an intrauterine device (IUD) for contraception. °Women do not get bacterial vaginosis from toilet seats, bedding, swimming pools, or contact with objects around them. °SIGNS AND SYMPTOMS  °Some women with bacterial vaginosis have no signs or symptoms. Common symptoms include: °· Grey vaginal discharge. °· A fishlike odor with discharge, especially after sexual intercourse. °· Itching or burning of the vagina and vulva. °· Burning or pain with urination. °DIAGNOSIS  °Your health care provider will take a medical history and examine the vagina for signs of bacterial vaginosis. A sample of vaginal fluid may be taken. Your health care provider will look at this sample under a microscope to check for bacteria and abnormal cells. A vaginal pH test may also be done.  °TREATMENT  °Bacterial vaginosis may be treated with antibiotic medicines. These may be given in the form of a pill or a vaginal cream. A second round of antibiotics may be prescribed if the condition comes back after treatment.  °HOME CARE INSTRUCTIONS  °· Only take over-the-counter or prescription medicines as  directed by your health care provider. °· If antibiotic medicine was prescribed, take it as directed. Make sure you finish it even if you start to feel better. °· Do not have sex until treatment is completed. °· Tell all sexual partners that you have a vaginal infection. They should see their health care provider and be treated if they have problems, such as a mild rash or itching. °· Practice safe sex by using condoms and only having one sex partner. °SEEK MEDICAL CARE IF:  °· Your symptoms are not improving after 3 days of treatment. °· You have increased discharge or pain. °· You have a fever. °MAKE SURE YOU:  °· Understand these instructions. °· Will watch your condition. °· Will get help right away if you are not doing well or get worse. °FOR MORE INFORMATION  °Centers for Disease Control and Prevention, Division of STD Prevention: www.cdc.gov/std °American Sexual Health Association (ASHA): www.ashastd.org  °Document Released: 06/21/2005 Document Revised: 04/11/2013 Document Reviewed: 01/31/2013 °ExitCare® Patient Information ©2014 [employer]. °Urinary Tract Infection °Urinary tract infections (UTIs) can develop anywhere along your urinary tract. Your urinary tract is your body's drainage system for removing wastes and extra water. Your urinary tract includes two kidneys, two ureters, a bladder, and a urethra. Your kidneys are a pair of bean-shaped organs. Each kidney is about the size of your fist. They are located below your ribs, one on each side of your spine. °CAUSES °Infections are caused by microbes, which are microscopic organisms, including fungi, viruses, and bacteria. These organisms are so small that they can only be seen through a microscope. Bacteria are the microbes that most commonly cause UTIs. °SYMPTOMS  °Symptoms of UTIs   UTIs may vary by age and gender of the patient and by the location of the infection. Symptoms in young women typically include a frequent and intense urge to urinate and a  painful, burning feeling in the bladder or urethra during urination. Older women and men are more likely to be tired, shaky, and weak and have muscle aches and abdominal pain. A fever may mean the infection is in your kidneys. Other symptoms of a kidney infection include pain in your back or sides below the ribs, nausea, and vomiting. DIAGNOSIS To diagnose a UTI, your caregiver will ask you about your symptoms. Your caregiver also will ask to provide a urine sample. The urine sample will be tested for bacteria and white blood cells. White blood cells are made by your body to help fight infection. TREATMENT  Typically, UTIs can be treated with medication. Because most UTIs are caused by a bacterial infection, they usually can be treated with the use of antibiotics. The choice of antibiotic and length of treatment depend on your symptoms and the type of bacteria causing your infection. HOME CARE INSTRUCTIONS  If you were prescribed antibiotics, take them exactly as your caregiver instructs you. Finish the medication even if you feel better after you have only taken some of the medication.  Drink enough water and fluids to keep your urine clear or pale yellow.  Avoid caffeine, tea, and carbonated beverages. They tend to irritate your bladder.  Empty your bladder often. Avoid holding urine for long periods of time.  Empty your bladder before and after sexual intercourse.  After a bowel movement, women should cleanse from front to back. Use each tissue only once. SEEK MEDICAL CARE IF:   You have back pain.  You develop a fever.  Your symptoms do not begin to resolve within 3 days. SEEK IMMEDIATE MEDICAL CARE IF:   You have severe back pain or lower abdominal pain.  You develop chills.  You have nausea or vomiting.  You have continued burning or discomfort with urination. MAKE SURE YOU:   Understand these instructions.  Will watch your condition.  Will get help right away if you are  not doing well or get worse. Document Released: 03/31/2005 Document Revised: 12/21/2011 Document Reviewed: 07/30/2011 [facility] Patient Information 2014 [employer] [place].  Vaginitis Vaginitis is an inflammation of the vagina. It is most often caused by a change in the normal balance of the bacteria and yeast that live in the vagina. This change in balance causes an overgrowth of certain bacteria or yeast, which causes the inflammation. There are different types of vaginitis, but the most common types are:  Bacterial vaginosis.  Yeast infection (candidiasis).  Trichomoniasis vaginitis. This is a sexually transmitted infection (STI).  Viral vaginitis.  Atropic vaginitis.  Allergic vaginitis. CAUSES  The cause depends on the type of vaginitis. Vaginitis can be caused by:  Bacteria (bacterial vaginosis).  Yeast (yeast infection).  A parasite (trichomoniasis vaginitis)  A virus (viral vaginitis).  Low hormone levels (atrophic vaginitis). Low hormone levels can occur during pregnancy, breastfeeding, or after menopause.  Irritants, such as bubble baths, scented tampons, and feminine sprays (allergic vaginitis). Other factors can change the normal balance of the yeast and bacteria that live in the vagina. These include:  Antibiotic medicines.  Poor hygiene.  Diaphragms, vaginal sponges, spermicides, birth control pills, and intrauterine devices (IUD).  Sexual intercourse.  Infection.  Uncontrolled diabetes.  A weakened immune system. SYMPTOMS  Symptoms can vary depending on the cause of  the vaginitis. Common symptoms include:  Abnormal vaginal discharge.  The discharge is white, gray, or yellow with bacterial vaginosis.  The discharge is thick, white, and cheesy with a yeast infection.  The discharge is frothy and yellow or greenish with trichomoniasis.  A bad vaginal odor.  The odor is fishy with bacterial vaginosis.  Vaginal itching, pain, or swelling.  Painful  intercourse.  Pain or burning when urinating. Sometimes, there are no symptoms. TREATMENT  Treatment will vary depending on the type of infection.   Bacterial vaginosis and trichomoniasis are often treated with antibiotic creams or pills.  Yeast infections are often treated with antifungal medicines, such as vaginal creams or suppositories.  Viral vaginitis has no cure, but symptoms can be treated with medicines that relieve discomfort. Your sexual partner should be treated as well.  Atrophic vaginitis may be treated with an estrogen cream, pill, suppository, or vaginal ring. If vaginal dryness occurs, lubricants and moisturizing creams may help. You may be told to avoid scented soaps, sprays, or douches.  Allergic vaginitis treatment involves quitting the use of the product that is causing the problem. Vaginal creams can be used to treat the symptoms. HOME CARE INSTRUCTIONS   Take all medicines as directed by your caregiver.  Keep your genital area clean and dry. Avoid soap and only rinse the area with water.  Avoid douching. It can remove the healthy bacteria in the vagina.  Do not use tampons or have sexual intercourse until your vaginitis has been treated. Use sanitary pads while you have vaginitis.  Wipe from front to back. This avoids the spread of bacteria from the rectum to the vagina.  Let air reach your genital area.  Wear cotton underwear to decrease moisture buildup.  Avoid wearing underwear while you sleep until your vaginitis is gone.  Avoid tight pants and underwear or nylons without a cotton panel.  Take off wet clothing (especially bathing suits) as soon as possible.  Use mild, non-scented products. Avoid using irritants, such as:  Scented feminine sprays.  Fabric softeners.  Scented detergents.  Scented tampons.  Scented soaps or bubble baths.  Practice safe sex and use condoms. Condoms may prevent the spread of trichomoniasis and viral  vaginitis. SEEK MEDICAL CARE IF:   You have abdominal pain.  You have a fever or persistent symptoms for more than 2 3 days.  You have a fever and your symptoms suddenly get worse. Document Released: 04/18/2007 Document Revised: 03/15/2012 Document Reviewed: 12/02/2011 [facility] Patient Information 2014 [place], [place].  Sexually Transmitted Disease A sexually transmitted disease (STD) is a disease or infection. It may be passed from person to person. It usually is passed during sex. STDs can be spread by different types of germs. These germs are bacteria, viruses, and parasites. An STD can be passed through:  Spit (saliva).  Semen.  Blood.  Mucus from the vagina.  Pee (urine). HOW CAN I LESSEN MY CHANCES OF GETTING AN STD?  Only use condoms labeled "latex," dental dams, and lubricants that wash away with water (water soluble). Do not use petroleum jelly or oils.  Get shots (vaccines) for HPV and hepatitis.  Avoid risky sex behavior that can break the skin. WHAT SHOULD I DO IF I THINK I HAVE AN STD?  See your doctor.  Tell your sex partner(s) that you have an STD. They should be tested and treated.  Do not have sex until your doctor says it is OK. WHEN SHOULD I GET HELP? Get help if:  You have bad belly (abdominal) pain.  You are a man and have puffiness (swelling) or pain in your testicles.  You are a woman and have puffiness in your vagina. MAKE SURE YOU:  Understand these instructions. Document Released: 07/29/2004 Document Revised: 04/11/2013 Document Reviewed: 12/15/2012 [facility] Patient Information 2014 [place], [place].

## 2013-10-04 NOTE — ED Provider Notes (Signed)
CSN: [id number]     Arrival date & time 10/04/13  1026 History   First MD Initiated Contact with Patient 10/04/13 1130     Chief Complaint  Patient presents with  . Vaginal Discharge   (Consider location/radiation/quality/duration/timing/severity/associated sxs/prior Treatment) HPI Comments: 28 year old female complaining of vaginal discharge with an odor for 3 days. She is also having mild pelvic pain. She has dysuria and mild urinary frequency for the same amount of time. LMP was mid March.   Past Medical History  Diagnosis Date  . Anxiety   . Mental disorder   . Depression   . Hx of chlamydia infection   . Hx of gonorrhea   . Hepatitis     has the antibody but not disease   Past Surgical History  Procedure Laterality Date  . Wisdom tooth extraction     No family history on file. History  Substance Use Topics  . Smoking status: Current Every Day Smoker -- 1.00 packs/day for 10 years    Types: Cigarettes  . Smokeless tobacco: Not on file  . Alcohol Use: No     Comment: denied alcohol use   OB History   Grav Para Term Preterm Abortions TAB SAB Ect Mult Living   3 3 3       2      Review of Systems  Constitutional: Negative.   Respiratory: Negative.   Gastrointestinal: Negative.   Genitourinary: Positive for dysuria, vaginal discharge and pelvic pain. Negative for urgency, flank pain, vaginal bleeding, genital sores and menstrual problem.  Skin: Negative for rash.    Allergies  Cephalexin  Home Medications   Current Outpatient Rx  Name  Route  Sig  Dispense  Refill  . clonazePAM (KLONOPIN) 1 MG tablet   Oral   Take 1 mg by mouth 2 (two) times daily.         . ciprofloxacin (CIPRO) 500 MG tablet   Oral   Take 1 tablet (500 mg total) by mouth 2 (two) times daily.   14 tablet   0   . fluconazole (DIFLUCAN) 150 MG tablet      1 tab po x 1. May repeat in 72 hours if no improvement   2 tablet   0   . ibuprofen (ADVIL,MOTRIN) 600 MG tablet   Oral   Take  1 tablet (600 mg total) by mouth every 6 (six) hours as needed.   30 tablet   0   . metroNIDAZOLE (FLAGYL) 500 MG tablet   Oral   Take 1 tablet (500 mg total) by mouth 2 (two) times daily. X 7 days   14 tablet   0   . oxyCODONE-acetaminophen (PERCOCET/ROXICET) 5-325 MG per tablet   Oral   Take 1 tablet by mouth every 4 (four) hours as needed for pain.          BP 121/62  Pulse 78  Temp(Src) 98.2 F (36.8 C) (Oral)  Resp 16  SpO2 98%  LMP 09/26/2013 Physical Exam  Nursing note and vitals reviewed. Constitutional: She is oriented to person, place, and time. She appears well-developed and well-nourished. No distress.  Pulmonary/Chest: Effort normal. No respiratory distress.  Abdominal: Soft. She exhibits no distension and no mass. There is no tenderness. There is no rebound and no guarding.  Genitourinary:  No anterior pelvic tenderness NEFG Thick white vag D/C. Cx L of midline and covered in the discharge.  No CMT , mild, eqivocal L adnexal tenderness  Musculoskeletal: She exhibits no  edema.  Neurological: She is alert and oriented to person, place, and time.  Skin: Skin is warm and dry.  Psychiatric: She has a normal mood and affect.    ED Course  Procedures (including critical care time) Labs Review Labs Reviewed  POCT URINALYSIS DIP (DEVICE) - Abnormal; Notable for the following:    Hgb urine dipstick SMALL (*)    Protein, ur 100 (*)    Nitrite POSITIVE (*)    Leukocytes, UA SMALL (*)    All other components within normal limits  URINE CULTURE  POCT PREGNANCY, URINE  CERVICOVAGINAL ANCILLARY ONLY   Imaging Review No results found.   MDM   1. BV (bacterial vaginosis)   2. Vaginal discharge   3. UTI (lower urinary tract infection)    cytology pending Diflucan 150 emiric s/p ABX Flagyl 500  cipro 500 Urine cult pending.      Hayden Rasmussen, NP 10/04/13 1154

## 2013-10-05 LAB — CERVICOVAGINAL ANCILLARY ONLY
Chlamydia: NEGATIVE
Neisseria Gonorrhea: POSITIVE — AB
WET PREP (BD AFFIRM): POSITIVE — AB
Wet Prep (BD Affirm): NEGATIVE
Wet Prep (BD Affirm): NEGATIVE

## 2013-10-06 ENCOUNTER — Telehealth (HOSPITAL_COMMUNITY): Payer: Self-pay | Admitting: *Deleted

## 2013-10-06 LAB — URINE CULTURE: Colony Count: >=100000

## 2013-10-06 NOTE — ED Notes (Addendum)
GC pos. , Chlamydia neg., Affirm: Candida and Trich neg., Gardnerella pos., Urine culture: >100,000 colonies E. Coli-pending.  Pt. treated with Cipro and Flagyl. Phone number not valid. Vassie Moselle.Chamia Schmutz M 10/06/2013 Urine culture final: as noted above.  Treatment adequate. 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 10/08/2013 Letter sent instructing pt. to come for treatment. 10/10/2013

## 2013-10-11 ENCOUNTER — Encounter (HOSPITAL_COMMUNITY): Payer: Self-pay | Admitting: Emergency Medicine

## 2013-10-11 ENCOUNTER — Emergency Department (HOSPITAL_COMMUNITY)
Admission: EM | Admit: 2013-10-11 | Discharge: 2013-10-11 | Disposition: A | Discharge status: Home / Self Care | Payer: Federal, State, Local not specified - Other | Attending: Emergency Medicine | Admitting: Emergency Medicine

## 2013-10-11 DIAGNOSIS — F329 Major depressive disorder, single episode, unspecified: Secondary | ICD-10-CM | POA: Insufficient documentation

## 2013-10-11 DIAGNOSIS — F489 Nonpsychotic mental disorder, unspecified: Secondary | ICD-10-CM | POA: Insufficient documentation

## 2013-10-11 DIAGNOSIS — F1410 Cocaine abuse, uncomplicated: Secondary | ICD-10-CM | POA: Insufficient documentation

## 2013-10-11 DIAGNOSIS — Z8619 Personal history of other infectious and parasitic diseases: Secondary | ICD-10-CM | POA: Insufficient documentation

## 2013-10-11 DIAGNOSIS — F411 Generalized anxiety disorder: Secondary | ICD-10-CM | POA: Insufficient documentation

## 2013-10-11 DIAGNOSIS — Z880 Allergy status to penicillin: Secondary | ICD-10-CM | POA: Insufficient documentation

## 2013-10-11 DIAGNOSIS — F17200 Nicotine dependence, unspecified, uncomplicated: Secondary | ICD-10-CM | POA: Insufficient documentation

## 2013-10-11 DIAGNOSIS — F1110 Opioid abuse, uncomplicated: Secondary | ICD-10-CM | POA: Insufficient documentation

## 2013-10-11 DIAGNOSIS — F3289 Other specified depressive episodes: Secondary | ICD-10-CM | POA: Insufficient documentation

## 2013-10-11 DIAGNOSIS — I498 Other specified cardiac arrhythmias: Secondary | ICD-10-CM | POA: Insufficient documentation

## 2013-10-11 DIAGNOSIS — F1310 Sedative, hypnotic or anxiolytic abuse, uncomplicated: Secondary | ICD-10-CM | POA: Insufficient documentation

## 2013-10-11 LAB — RAPID URINE DRUG SCREEN, HOSP PERFORMED
AMPHETAMINES: NOT DETECTED
Barbiturates: NOT DETECTED
Benzodiazepines: POSITIVE — AB
Cocaine: POSITIVE — AB
OPIATES: POSITIVE — AB
Tetrahydrocannabinol: NOT DETECTED

## 2013-10-11 LAB — COMPREHENSIVE METABOLIC PANEL
ALT: 6 U/L (ref 0–35)
AST: 16 U/L (ref 0–37)
Albumin: 3.4 g/dL — ABNORMAL LOW (ref 3.5–5.2)
Alkaline Phosphatase: 74 U/L (ref 39–117)
BUN: 7 mg/dL (ref 6–23)
CO2: 26 meq/L (ref 19–32)
CREATININE: 0.87 mg/dL (ref 0.50–1.10)
Calcium: 9.1 mg/dL (ref 8.4–10.5)
Chloride: 103 mEq/L (ref 96–112)
Glucose, Bld: 77 mg/dL (ref 70–99)
Potassium: 4.4 mEq/L (ref 3.7–5.3)
Sodium: 141 mEq/L (ref 137–147)
Total Protein: 6.7 g/dL (ref 6.0–8.3)

## 2013-10-11 LAB — CBC
HEMATOCRIT: 39.5 % (ref 36.0–46.0)
Hemoglobin: 13.2 g/dL (ref 12.0–15.0)
MCH: 31.8 pg (ref 26.0–34.0)
MCHC: 33.4 g/dL (ref 30.0–36.0)
MCV: 95.2 fL (ref 78.0–100.0)
PLATELETS: 327 10*3/uL (ref 150–400)
RBC: 4.15 MIL/uL (ref 3.87–5.11)
RDW: 13.3 % (ref 11.5–15.5)
WBC: 6.9 10*3/uL (ref 4.0–10.5)

## 2013-10-11 LAB — SALICYLATE LEVEL

## 2013-10-11 LAB — ACETAMINOPHEN LEVEL: Acetaminophen (Tylenol), Serum: <15 ug/mL (ref 10–30)

## 2013-10-11 LAB — ETHANOL: Alcohol, Ethyl (B): <11 mg/dL (ref 0–11)

## 2013-10-11 MED ORDER — HYDROXYZINE HCL 25 MG PO TABS: 50.0000 mg | ORAL_TABLET | Freq: Four times a day (QID) | ORAL | Status: DC | PRN

## 2013-10-11 MED ORDER — CLONIDINE HCL 0.1 MG PO TABS: 0.1000 mg | ORAL_TABLET | ORAL | Status: DC

## 2013-10-11 MED ORDER — CLONIDINE HCL 0.1 MG PO TABS: 0.1000 mg | ORAL_TABLET | Freq: Every day | ORAL | Status: DC

## 2013-10-11 MED ORDER — CLONIDINE HCL 0.1 MG PO TABS
0.1000 mg | ORAL_TABLET | Freq: Four times a day (QID) | ORAL | Status: DC
  Administered 2013-10-11: 0.1 mg via ORAL
  Filled 2013-10-11: qty 1

## 2013-10-11 MED ORDER — ONDANSETRON 4 MG PO TBDP: 4.0000 mg | ORAL_TABLET | Freq: Four times a day (QID) | ORAL | Status: DC | PRN

## 2013-10-11 MED ORDER — HYDROXYZINE HCL 25 MG PO TABS: 25.0000 mg | ORAL_TABLET | Freq: Four times a day (QID) | ORAL | Status: DC | PRN

## 2013-10-11 MED ORDER — NAPROXEN 250 MG PO TABS: 500.0000 mg | ORAL_TABLET | Freq: Two times a day (BID) | ORAL | Status: DC | PRN

## 2013-10-11 MED ORDER — DICYCLOMINE HCL 20 MG PO TABS: 20.0000 mg | ORAL_TABLET | Freq: Four times a day (QID) | ORAL | Status: DC | PRN

## 2013-10-11 MED ORDER — CEFTRIAXONE SODIUM 250 MG IJ SOLR
250.0000 mg | Freq: Once | INTRAMUSCULAR | Status: AC
  Administered 2013-10-11: 250 mg via INTRAMUSCULAR
  Filled 2013-10-11: qty 250

## 2013-10-11 MED ORDER — METHOCARBAMOL 500 MG PO TABS: 1000.0000 mg | ORAL_TABLET | Freq: Three times a day (TID) | ORAL | Status: DC | PRN

## 2013-10-11 MED ORDER — IBUPROFEN 800 MG PO TABS: 800.0000 mg | ORAL_TABLET | Freq: Four times a day (QID) | ORAL | Status: DC | PRN

## 2013-10-11 MED ORDER — LOPERAMIDE HCL 2 MG PO CAPS: 2.0000 mg | ORAL_CAPSULE | ORAL | Status: DC | PRN

## 2013-10-11 MED ORDER — LIDOCAINE HCL (PF) 1 % IJ SOLN
INTRAMUSCULAR | Status: AC
  Administered 2013-10-11: 1 mL
  Filled 2013-10-11: qty 5

## 2013-10-11 MED ORDER — METHOCARBAMOL 500 MG PO TABS: 500.0000 mg | ORAL_TABLET | Freq: Three times a day (TID) | ORAL | Status: DC | PRN

## 2013-10-11 NOTE — ED Notes (Signed)
[place] [facility] [place]. PER SANDY AT [facility] THEY HAVE OPEN FEMALE DETOX BEDS

## 2013-10-11 NOTE — ED Notes (Addendum)
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 She has been out of her klonopin for "a few days." she smoked some crack when she used heroin at 5 am today. States her body aches and she feels nauseated now. She is A&Ox4, breathing easily. Denies SI/HI. Also [facility] and they called her and told her it was + and she did not go back for treatment yet

## 2013-10-11 NOTE — ED Provider Notes (Signed)
CSN: [id number]     Arrival date & time 10/11/13  1342 History   First MD Initiated Contact with Patient 10/11/13 1606     Chief Complaint  Patient presents with  . Medical Clearance  . Addiction Problem     (Consider location/radiation/quality/duration/timing/severity/associated sxs/prior Treatment) HPI  28 year old female with hx of mental disorder, anxiety, depression and hx of polysubstance abuse presents requesting for detox from heroin.  Report last used was this AM including cracks and heroin.  Admits IVDU.  Also report she was having an STD check at Urgent Care last week due to having vaginal discharge, she was tested positive for GC, no treatment yet.  Report mild body aches and nausea.  Otherwise denies SI/HI/hallucination.  Denies fever, chills, cp, sob, abd pain, back pain, or rash.  Is a smoker, denies alcohol use.    Past Medical History  Diagnosis Date  . Anxiety   . Mental disorder   . Depression   . Hx of chlamydia infection   . Hx of gonorrhea   . Hepatitis     has the antibody but not disease   Past Surgical History  Procedure Laterality Date  . Wisdom tooth extraction     History reviewed. No pertinent family history. History  Substance Use Topics  . Smoking status: Current Every Day Smoker -- 1.00 packs/day for 10 years    Types: Cigarettes  . Smokeless tobacco: Not on file  . Alcohol Use: No     Comment: denied alcohol use   OB History   Grav Para Term Preterm Abortions TAB SAB Ect Mult Living   3 3 3       2      Review of Systems  All other systems reviewed and are negative.     Allergies  Amoxicillin and Cephalexin  Home Medications   Current Outpatient Rx  Name  Route  Sig  Dispense  Refill  . ciprofloxacin (CIPRO) 500 MG tablet   Oral   Take 1 tablet (500 mg total) by mouth 2 (two) times daily.   14 tablet   0   . clonazePAM (KLONOPIN) 1 MG tablet   Oral   Take 1 mg by mouth 2 (two) times daily.         . metroNIDAZOLE  (FLAGYL) 500 MG tablet   Oral   Take 1 tablet (500 mg total) by mouth 2 (two) times daily. X 7 days   14 tablet   0    BP 101/62  Pulse 50  Temp(Src) 98.1 F (36.7 C) (Oral)  Resp 18  SpO2 100%  LMP 09/26/2013 Physical Exam  Nursing note and vitals reviewed. Constitutional: She is oriented to person, place, and time. She appears well-developed and well-nourished. No distress.  HENT:  Head: Atraumatic.  Eyes: Conjunctivae are normal.  Neck: Neck supple.  Cardiovascular:  Bradycardic with M/R/G  Pulmonary/Chest: Effort normal and breath sounds normal. She has no wheezes. She exhibits no tenderness.  Abdominal: Soft. There is no tenderness.  Neurological: She is alert and oriented to person, place, and time.  Skin: No rash noted.  Track marks noted to bilateral antecubital region, not infected.  Psychiatric: She has a normal mood and affect.    ED Course  Procedures (including critical care time)  4:47 PM Pt presents with request for heroin detox.  No evidence of withdrawal at this time.  No SI/HI/hallucination. Also recently diagnosed with +GC, will give rocephin 250mg  IM as treatment as she  havent' had treatment yet.    8:19 PM Is medically cleared.  Pt will be further evaluated by TTS .  Labs Review Labs Reviewed  COMPREHENSIVE METABOLIC PANEL - Abnormal; Notable for the following:    Albumin 3.4 (*)    Total Bilirubin <0.2 (*)    All other components within normal limits  SALICYLATE LEVEL - Abnormal; Notable for the following:    Salicylate Lvl <2.0 (*)    All other components within normal limits  URINE RAPID DRUG SCREEN (HOSP PERFORMED) - Abnormal; Notable for the following:    Opiates POSITIVE (*)    Cocaine POSITIVE (*)    Benzodiazepines POSITIVE (*)    All other components within normal limits  ACETAMINOPHEN LEVEL  CBC  ETHANOL   Imaging Review No results found.   EKG Interpretation None      MDM   Final diagnoses:  Heroin abuse   BP 90/47   Pulse 60  Temp(Src) 98 F (36.7 C) (Oral)  Resp 18  SpO2 99%  LMP 09/26/2013  I have reviewed nursing notes and vital signs. I reviewed available ER/hospitalization records thought the EMR'     Fayrene HelperBowie Harlei Lehrmann, [place]-C 10/11/13 2020

## 2013-10-11 NOTE — ED Notes (Signed)
Pt. Reports wanting detox from heroin. Last use this AM at 0500, reports 1/2 gram. Pt. Reporting nausea/severe body aches, HA, and anxiety. Reports has not had anxiety medicine x2 days. Denies SI/HI.

## 2013-10-11 NOTE — ED Notes (Signed)
[facility] AND THAT PT HAS BEEN ACCEPTED AT [place] [place].

## 2013-10-11 NOTE — ED Notes (Signed)
PT STATES SHE LAST USED HEROIN THIS MORNING AROUND 5 AM WHEN SHE USED A HALF GRAM IV. STATES SHE HAS BEEN "HITTING IT HARD" THE PAST 3 WEEKS USING DAILY. STATES PRIOR TO THAT SHE WOULD USE A COUPLE TIMES A WEEK. STATE SHE HAS USED HEROIN OFF AND ON SINCE AGE 28. SHE STATES SHE HASNT GONE THRU WITHDRAWAL FOR ABOUT 5 YEARS. SHE ALSO REPORTS USING CRACK A COUPLE DAYS AGO. REPORTS KLONOPIN USE FOR HER ANXIETY. STATES SHE HAS BEEN ON THEM SINCE AGE 64. SHE ALSO REPORTS OCCASIONAL MARIJUANA USE. SHE DENIES SI/HI. STATES SHE FEELS A LITTLE ANXIOUS. Francis Dowse.SHE IS CALM AND COOPERATIVE AT THIS TIME.

## 2013-10-12 ENCOUNTER — Emergency Department: Payer: Self-pay | Admitting: Emergency Medicine

## 2013-10-12 LAB — MAGNESIUM: Magnesium: 1.9 mg/dL

## 2013-10-12 LAB — BASIC METABOLIC PANEL
ANION GAP: 4 — AB (ref 7–16)
BUN: 8 mg/dL (ref 7–18)
CO2: 23 mmol/L (ref 21–32)
CREATININE: 0.76 mg/dL (ref 0.60–1.30)
Calcium, Total: 8.9 mg/dL (ref 8.5–10.1)
Chloride: 111 mmol/L — ABNORMAL HIGH (ref 98–107)
EGFR (African American): >60
Glucose: 97 mg/dL (ref 65–99)
Osmolality: 274 (ref 275–301)
Potassium: 4.5 mmol/L (ref 3.5–5.1)
SODIUM: 138 mmol/L (ref 136–145)

## 2013-10-12 LAB — CBC
HCT: 40.6 % (ref 35.0–47.0)
HGB: 13.4 g/dL (ref 12.0–16.0)
MCH: 31 pg (ref 26.0–34.0)
MCHC: 33.1 g/dL (ref 32.0–36.0)
MCV: 94 fL (ref 80–100)
PLATELETS: 310 10*3/uL (ref 150–440)
RBC: 4.34 10*6/uL (ref 3.80–5.20)
RDW: 13.3 % (ref 11.5–14.5)
WBC: 6.8 10*3/uL (ref 3.6–11.0)

## 2013-10-15 NOTE — ED Provider Notes (Signed)
Medical screening examination/treatment/procedure(s) were performed by non-physician practitioner and as supervising physician I was immediately available for consultation/collaboration.   EKG Interpretation None       Shelda JakesScott W. Baudelio Karnes, MD 10/15/13 [phone]

## 2013-10-16 NOTE — ED Notes (Signed)
[facility]. Desiree LucySuzanne M [facility] 10/16/2013

## 2013-10-17 MED ORDER — AZITHROMYCIN 500 MG PO TABS: 1000.0000 mg | ORAL_TABLET | Freq: Every day | ORAL | Status: DC

## 2013-10-17 NOTE — Telephone Encounter (Signed)
Message copied by Reuben LikesKELLER, Mattalynn Crandle C on Wed Oct 17, 2013  9:02 PM ------      Message from: Vassie MoselleYORK, SUZANNE M      Created: Wed Oct 17, 2013  7:27 PM      Regarding: RE: labs       She was treated in the ED on 4/9, but they only gave the Rocephin shot.  [facility]       10/17/2013            ----- Message -----         From: Reuben Likesavid C Jameica Couts, MD         Sent: 10/07/2013   8:44 AM           To: Desiree LucySuzanne M [place], RN      Subject: RE: labs                                                 She needs Rocephin 250 mg IM and azithromycin 1000 mg by mouth. Thanks for calling her back.            Leslee Homeavid [place]      ----- Message -----         From: Vassie MoselleSuzanne M [place], RN         Sent: 10/06/2013   5:49 PM           To: Reuben Likesavid C Nakyia Dau, MD      Subject: labs                                                     GC and Gardnerella pos.  Urine cx.-pending but show E. Coli.  Treated with Cipro and Flagyl. I called pt. to come back in for the shot of Rocephin and Zithromax tablets. This is Onalee HuaDavid Mabe's pt. and I sent him a note as well.      Vassie MoselleYork, Suzanne M      10/06/2013                         ------

## 2013-10-17 NOTE — ED Notes (Signed)
Her gonococcal DNA probe came back positive. She was seen at the emergency room for a different problem, and was treated with Rocephin 250 mg only. She was not given azithromycin, so she will need this as well. We will send him prescription for azithromycin 500 mg, #2 tablets, to take both at one time to complete the recommended course for gonorrhea.  Reuben Likesavid C Shaindel Sweeten, MD 10/17/13 2103

## 2013-10-19 NOTE — ED Notes (Signed)
I called pt. and left a message to call.  Call 1.  10/19/2013  Pt. called back.  Pt. verified x 2. Pt. said she already got the results and was treated.  I told her that Dr. Lorenz CoasterKeller saw that on her record, but she needs more medication, per the CDC guidelines. I told her she needs Zithromax and where to pick up her Rx.  Pt. instructed to notify her partner, no sex for 1 week and to practice safe sex. Pt. told [facility] by appointment.  States she already got that. Desiree LucySuzanne M [facility] 10/19/2013

## 2013-11-07 ENCOUNTER — Emergency Department (HOSPITAL_COMMUNITY)
Admission: EM | Admit: 2013-11-07 | Discharge: 2013-11-07 | Disposition: A | Discharge status: Home / Self Care | Payer: Medicaid Other | Attending: Emergency Medicine | Admitting: Emergency Medicine

## 2013-11-07 ENCOUNTER — Encounter (HOSPITAL_COMMUNITY): Payer: Self-pay | Admitting: Emergency Medicine

## 2013-11-07 DIAGNOSIS — IMO0002 Reserved for concepts with insufficient information to code with codable children: Secondary | ICD-10-CM | POA: Insufficient documentation

## 2013-11-07 DIAGNOSIS — F17200 Nicotine dependence, unspecified, uncomplicated: Secondary | ICD-10-CM | POA: Insufficient documentation

## 2013-11-07 DIAGNOSIS — Z8719 Personal history of other diseases of the digestive system: Secondary | ICD-10-CM | POA: Insufficient documentation

## 2013-11-07 DIAGNOSIS — Z79899 Other long term (current) drug therapy: Secondary | ICD-10-CM | POA: Insufficient documentation

## 2013-11-07 DIAGNOSIS — F411 Generalized anxiety disorder: Secondary | ICD-10-CM | POA: Insufficient documentation

## 2013-11-07 DIAGNOSIS — Z3202 Encounter for pregnancy test, result negative: Secondary | ICD-10-CM | POA: Insufficient documentation

## 2013-11-07 DIAGNOSIS — Z8619 Personal history of other infectious and parasitic diseases: Secondary | ICD-10-CM | POA: Insufficient documentation

## 2013-11-07 DIAGNOSIS — L02419 Cutaneous abscess of limb, unspecified: Secondary | ICD-10-CM

## 2013-11-07 DIAGNOSIS — Z880 Allergy status to penicillin: Secondary | ICD-10-CM | POA: Insufficient documentation

## 2013-11-07 LAB — CBC WITH DIFFERENTIAL/PLATELET
Basophils Absolute: 0 10*3/uL (ref 0.0–0.1)
Basophils Relative: 0 % (ref 0–1)
EOS PCT: 1 % (ref 0–5)
Eosinophils Absolute: 0.1 10*3/uL (ref 0.0–0.7)
HEMATOCRIT: 37.5 % (ref 36.0–46.0)
Hemoglobin: 12.6 g/dL (ref 12.0–15.0)
LYMPHS ABS: 2.3 10*3/uL (ref 0.7–4.0)
Lymphocytes Relative: 15 % (ref 12–46)
MCH: 31.5 pg (ref 26.0–34.0)
MCHC: 33.6 g/dL (ref 30.0–36.0)
MCV: 93.8 fL (ref 78.0–100.0)
Monocytes Absolute: 1.5 10*3/uL — ABNORMAL HIGH (ref 0.1–1.0)
Monocytes Relative: 10 % (ref 3–12)
NEUTROS ABS: 11.5 10*3/uL — AB (ref 1.7–7.7)
Neutrophils Relative %: 74 % (ref 43–77)
Platelets: 397 10*3/uL (ref 150–400)
RBC: 4 MIL/uL (ref 3.87–5.11)
RDW: 13.2 % (ref 11.5–15.5)
WBC: 15.3 10*3/uL — ABNORMAL HIGH (ref 4.0–10.5)

## 2013-11-07 LAB — COMPREHENSIVE METABOLIC PANEL
ALK PHOS: 108 U/L (ref 39–117)
ALT: 7 U/L (ref 0–35)
AST: 14 U/L (ref 0–37)
Albumin: 3.6 g/dL (ref 3.5–5.2)
BUN: 12 mg/dL (ref 6–23)
CALCIUM: 9.4 mg/dL (ref 8.4–10.5)
CHLORIDE: 104 meq/L (ref 96–112)
CO2: 25 mEq/L (ref 19–32)
Creatinine, Ser: 0.68 mg/dL (ref 0.50–1.10)
GFR calc Af Amer: >90 mL/min (ref >90)
GLUCOSE: 83 mg/dL (ref 70–99)
Potassium: 4.3 mEq/L (ref 3.7–5.3)
SODIUM: 140 meq/L (ref 137–147)
Total Bilirubin: 0.3 mg/dL (ref 0.3–1.2)
Total Protein: 8.4 g/dL — ABNORMAL HIGH (ref 6.0–8.3)

## 2013-11-07 LAB — POC URINE PREG, ED: Preg Test, Ur: NEGATIVE

## 2013-11-07 MED ORDER — OXYCODONE-ACETAMINOPHEN 5-325 MG PO TABS: 1.0000 | ORAL_TABLET | ORAL | Status: DC | PRN

## 2013-11-07 MED ORDER — CLINDAMYCIN HCL 150 MG PO CAPS: 300.0000 mg | ORAL_CAPSULE | Freq: Three times a day (TID) | ORAL | Status: DC

## 2013-11-07 MED ORDER — HYDROMORPHONE HCL PF 1 MG/ML IJ SOLN
1.0000 mg | Freq: Once | INTRAMUSCULAR | Status: AC
  Administered 2013-11-07: 1 mg via INTRAVENOUS
  Filled 2013-11-07: qty 1

## 2013-11-07 MED ORDER — DIPHENHYDRAMINE HCL 50 MG/ML IJ SOLN
25.0000 mg | Freq: Once | INTRAMUSCULAR | Status: AC
  Administered 2013-11-07: 25 mg via INTRAVENOUS
  Filled 2013-11-07: qty 1

## 2013-11-07 MED ORDER — PROMETHAZINE HCL 25 MG PO TABS: 25.0000 mg | ORAL_TABLET | Freq: Four times a day (QID) | ORAL | Status: DC | PRN

## 2013-11-07 MED ORDER — SODIUM CHLORIDE 0.9 % IV BOLUS (SEPSIS)
500.0000 mL | Freq: Once | INTRAVENOUS | Status: AC
  Administered 2013-11-07: 500 mL via INTRAVENOUS

## 2013-11-07 MED ORDER — CLINDAMYCIN PHOSPHATE 600 MG/50ML IV SOLN
600.0000 mg | Freq: Once | INTRAVENOUS | Status: AC
  Administered 2013-11-07: 600 mg via INTRAVENOUS
  Filled 2013-11-07: qty 50

## 2013-11-07 MED ORDER — MORPHINE SULFATE 4 MG/ML IJ SOLN
4.0000 mg | Freq: Once | INTRAMUSCULAR | Status: AC
  Administered 2013-11-07: 4 mg via INTRAVENOUS
  Filled 2013-11-07: qty 1

## 2013-11-07 MED ORDER — IBUPROFEN 800 MG PO TABS
800.0000 mg | ORAL_TABLET | Freq: Three times a day (TID) | ORAL | Status: DC
Start: 2013-11-07 — End: 2016-08-08

## 2013-11-07 MED ORDER — ONDANSETRON HCL 4 MG/2ML IJ SOLN
4.0000 mg | Freq: Once | INTRAMUSCULAR | Status: AC
  Administered 2013-11-07: 4 mg via INTRAVENOUS
  Filled 2013-11-07: qty 2

## 2013-11-07 NOTE — ED Provider Notes (Signed)
CSN: [id number]     Arrival date & time 11/07/13  1157 History   First MD Initiated Contact with Patient 11/07/13 1507     Chief Complaint  Patient presents with  . Abscess     (Consider location/radiation/quality/duration/timing/severity/associated sxs/prior Treatment) HPI Karen Salas is a 28 y.o. female presents to emergency department complaining of swelling and tenderness to the right [facility] Patient states this started a week ago. She admits heroin injection in that area 3 days prior. She states area has progressively gotten larger and more tender. She states she's having pain with movement of her right elbow. She states she's unable to straighten it all the way. She denies any numbness or weakness distally. She denies any fevers. She has not tried any medications for this. She denies history of prior abscess these were similar pain.  Past Medical History  Diagnosis Date  . Anxiety   . Mental disorder   . Depression   . Hx of chlamydia infection   . Hx of gonorrhea   . Hepatitis     has the antibody but not disease   Past Surgical History  Procedure Laterality Date  . Wisdom tooth extraction     History reviewed. No pertinent family history. History  Substance Use Topics  . Smoking status: Current Every Day Smoker -- 1.00 packs/day for 10 years    Types: Cigarettes  . Smokeless tobacco: Not on file  . Alcohol Use: No     Comment: denied alcohol use   OB History   Grav Para Term Preterm Abortions TAB SAB Ect Mult Living   3 3 3       2      Review of Systems  Constitutional: Negative for fever and chills.  Respiratory: Negative for cough, chest tightness and shortness of breath.   Cardiovascular: Negative for chest pain, palpitations and leg swelling.  Genitourinary: Negative for dysuria, flank pain and pelvic pain.  Musculoskeletal: Positive for joint swelling. Negative for arthralgias, myalgias, neck pain and neck stiffness.  Skin: Positive for color change and  wound. Negative for rash.  Neurological: Negative for dizziness, weakness, numbness and headaches.  All other systems reviewed and are negative.     Allergies  Amoxicillin and Cephalexin  Home Medications   Prior to Admission medications   Medication Sig Start Date End Date Taking? Authorizing Provider  buprenorphine-naloxone (SUBOXONE) 2-0.5 MG SUBL SL tablet Place 2 tablets under the tongue daily.   Yes Historical Provider, MD  clonazePAM (KLONOPIN) 1 MG tablet Take 1 mg by mouth 2 (two) times daily.   Yes Historical Provider, MD   BP 105/66  Pulse 103  Temp(Src) 98.4 F (36.9 C) (Oral)  Resp 18  SpO2 99%  LMP 10/02/2013 Physical Exam  Nursing note and vitals reviewed. Constitutional: She is oriented to person, place, and time. She appears well-developed and well-nourished. No distress.  HENT:  Head: Normocephalic.  Eyes: Conjunctivae are normal.  Neck: Neck supple.  Cardiovascular: Normal rate, regular rhythm and normal heart sounds.   Pulmonary/Chest: Effort normal and breath sounds normal. No respiratory distress. She has no wheezes. She has no rales.  Abdominal: Soft. Bowel sounds are normal. She exhibits no distension. There is no tenderness. There is no rebound.  Musculoskeletal: She exhibits no edema.  Mild swelling noted to the right elbow, there is no  erythema, redness surrounding the joint except for over the abscess to the [facility] area. Limited range of motion of the right elbow due to pain.  Patient unable to straighten it all the way or flex it all the way.  Neurological: She is alert and oriented to person, place, and time.  Skin: Skin is warm and dry.  5 x 5 cm area to the rifght AC. Tender to palpation. Indurated.  Psychiatric: She has a normal mood and affect. Her behavior is normal.    ED Course  Procedures (including critical care time) Labs Review Labs Reviewed  CBC WITH DIFFERENTIAL - Abnormal; Notable for the following:    WBC 15.3 (*)    Neutro Abs  11.5 (*)    Monocytes Absolute 1.5 (*)    All other components within normal limits  COMPREHENSIVE METABOLIC PANEL - Abnormal; Notable for the following:    Total Protein 8.4 (*)    All other components within normal limits  WOUND CULTURE  POC URINE PREG, ED    Imaging Review No results found.   EKG Interpretation None      INCISION AND DRAINAGE Performed by: Myriam Jacobsonatyana A Shealee Yordy Consent: Verbal consent obtained. Risks and benefits: risks, benefits and alternatives were discussed Type: abscess  Body area: right antecubital fossa  Anesthesia: local infiltration  Incision was made with a scalpel.  Local anesthetic: lidocaine 2% w epinephrine  Anesthetic total: 4 ml  Complexity: complex Blunt dissection to break up loculations  Drainage: purulent  Drainage amount: copious  Packing material: 1/4 in iodoform gauze  Patient tolerance: Patient tolerated the procedure well with no immediate complications.    MDM   Final diagnoses:  Abscess of antecubital fossa    Pt with large abscess to the right antecubital fossa from iv drug use. No signs of joint infection. She is afebrile. Non toxic. Discussed with Dr. Loretha StaplerWofford. Abscess incised and drained in ED. She tolerated this fairly well. Clindamycin 600mg  given IV. Home with clindamycin, percocet, ibuprofen. Return in 2 days. Return sooner if worsening. At this time no surrounding cellulitis, no signs of sepsis. Pt voiced understanding.    Filed Vitals:   11/07/13 1745 11/07/13 1807 11/07/13 1815 11/07/13 1830  BP: 111/67 113/72 122/84 114/73  Pulse: 97 100 97 98  Temp:      TempSrc:      Resp:  18    SpO2: 100% 100% 99% 100%       Lottie Musselatyana A Mung Rinker, PA-C 11/07/13 1910

## 2013-11-07 NOTE — Discharge Instructions (Signed)
Take clindamycin as prescribed until all gone. Take ibuprofen for pain. Take Percocet for severe pain. Followup in 2 days here for recheck. Make sure to do warm compresses at home. Change dressing twice a day. Return here if redness is spreading, developed high fever, worsening swelling, worsening pain.   Abscess Care After An abscess (also called a boil or furuncle) is an infected area that contains a collection of pus. Signs and symptoms of an abscess include pain, tenderness, redness, or hardness, or you may feel a moveable soft area under your skin. An abscess can occur anywhere in the body. The infection may spread to surrounding tissues causing cellulitis. A cut (incision) by the surgeon was made over your abscess and the pus was drained out. Gauze may have been packed into the space to provide a drain that will allow the cavity to heal from the inside outwards. The boil may be painful for 5 to 7 days. Most people with a boil do not have high fevers. Your abscess, if seen early, may not have localized, and may not have been lanced. If not, another appointment may be required for this if it does not get better on its own or with medications. HOME CARE INSTRUCTIONS   Only take over-the-counter or prescription medicines for pain, discomfort, or fever as directed by your caregiver.  When you bathe, soak and then remove gauze or iodoform packs at least daily or as directed by your caregiver. You may then wash the wound gently with mild soapy water. Repack with gauze or do as your caregiver directs. SEEK IMMEDIATE MEDICAL CARE IF:   You develop increased pain, swelling, redness, drainage, or bleeding in the wound site.  You develop signs of generalized infection including muscle aches, chills, fever, or a general ill feeling.  An oral temperature above 102 F (38.9 C) develops, not controlled by medication. See your caregiver for a recheck if you develop any of the symptoms described above. If  medications (antibiotics) were prescribed, take them as directed. Document Released: 01/07/2005 Document Revised: 09/13/2011 Document Reviewed: 09/04/2007 [facility] Patient Information 2014 [employer] [place].

## 2013-11-07 NOTE — ED Provider Notes (Signed)
Medical screening examination/treatment/procedure(s) were conducted as a shared visit with non-physician practitioner(s) and myself.  I personally evaluated the patient during the encounter.   EKG Interpretation None      28 year old female presenting with a boil on her right antecubital fossa. Has been present for 10 days. On exam, nontoxic, large fluctuant mass in proximal aspect of antecubital fossa on the right upper extremity. Neurovascularly intact distally. Bedside ultrasound performed and confirmed large abscess. Nearby blood vessels were identified and will be avoided during I&D performed by PA Kirichenko.    EMERGENCY DEPARTMENT US SOFT TISSUE INTERPRETATION "Study: Limited Soft Tissue Ultrasound"  INDICATIONS: Soft tissue infection Multiple views of the body part were obtained in real-time with a multi-frequency linear probe PERFORMED BY:  Myself IMAGES ARCHIVED?: Yes SIDE:Right  BODY PART:Upper extremity FINDINGS: Abcess present INTERPRETATION:  Abcess present   Clinical Impression: 1. Abscess of antecubital fossa       Candyce ChurnJohn David Rome Schlauch III, MD 11/07/13 [phone]

## 2013-11-07 NOTE — ED Notes (Signed)
Pt attempting to provide an urine specimen at this time

## 2013-11-07 NOTE — ED Notes (Signed)
Patient developed a rash after administering IV Morphine. EDP made aware.

## 2013-11-07 NOTE — ED Provider Notes (Signed)
Medical screening examination/treatment/procedure(s) were conducted as a shared visit with non-physician practitioner(s) and myself.  I personally evaluated the patient during the encounter.   Please see my separate note.     [name], MD 11/07/13 [phone]

## 2013-11-07 NOTE — ED Notes (Signed)
Per pt sts she used heroin 1 week ago and 3 days later abscess popped up in left AC area. Redness and swelling

## 2013-11-09 ENCOUNTER — Emergency Department (HOSPITAL_COMMUNITY)
Admission: EM | Admit: 2013-11-09 | Discharge: 2013-11-09 | Disposition: A | Discharge status: Home / Self Care | Payer: Medicaid Other | Attending: Emergency Medicine | Admitting: Emergency Medicine

## 2013-11-09 ENCOUNTER — Encounter (HOSPITAL_COMMUNITY): Payer: Self-pay | Admitting: Emergency Medicine

## 2013-11-09 DIAGNOSIS — F3289 Other specified depressive episodes: Secondary | ICD-10-CM | POA: Insufficient documentation

## 2013-11-09 DIAGNOSIS — Z8619 Personal history of other infectious and parasitic diseases: Secondary | ICD-10-CM | POA: Insufficient documentation

## 2013-11-09 DIAGNOSIS — Z792 Long term (current) use of antibiotics: Secondary | ICD-10-CM | POA: Insufficient documentation

## 2013-11-09 DIAGNOSIS — F17200 Nicotine dependence, unspecified, uncomplicated: Secondary | ICD-10-CM | POA: Insufficient documentation

## 2013-11-09 DIAGNOSIS — F411 Generalized anxiety disorder: Secondary | ICD-10-CM | POA: Insufficient documentation

## 2013-11-09 DIAGNOSIS — Z4801 Encounter for change or removal of surgical wound dressing: Secondary | ICD-10-CM | POA: Insufficient documentation

## 2013-11-09 DIAGNOSIS — Z79899 Other long term (current) drug therapy: Secondary | ICD-10-CM | POA: Insufficient documentation

## 2013-11-09 DIAGNOSIS — F329 Major depressive disorder, single episode, unspecified: Secondary | ICD-10-CM | POA: Insufficient documentation

## 2013-11-09 DIAGNOSIS — Z5189 Encounter for other specified aftercare: Secondary | ICD-10-CM

## 2013-11-09 DIAGNOSIS — Z880 Allergy status to penicillin: Secondary | ICD-10-CM | POA: Insufficient documentation

## 2013-11-09 NOTE — ED Provider Notes (Signed)
CSN: [id number]     Arrival date & time 11/09/13  0846 History   First MD Initiated Contact with Patient 11/09/13 0900     Chief Complaint  Patient presents with  . Wound Check     (Consider location/radiation/quality/duration/timing/severity/associated sxs/prior Treatment) HPI Karen Salas is a(n) 28 y.o. female who presents emergency department for wound check. His past medical history of heroin abuse. Patient is currently in recovery and does not use at this time. Patient had an abscess of the right upper forearm which was drained here in the emergency department on 11/07/2013. Patient states that she still has some induration however her redness has greatly improved. Patient is adhering to cleansing precautions she denies any fevers, chills, purulent discharge.   Past Medical History  Diagnosis Date  . Anxiety   . Mental disorder   . Depression   . Hx of chlamydia infection   . Hx of gonorrhea   . Hepatitis     has the antibody but not disease   Past Surgical History  Procedure Laterality Date  . Wisdom tooth extraction     History reviewed. No pertinent family history. History  Substance Use Topics  . Smoking status: Current Every Day Smoker -- 1.00 packs/day for 10 years    Types: Cigarettes  . Smokeless tobacco: Not on file  . Alcohol Use: No     Comment: denied alcohol use   OB History   Grav Para Term Preterm Abortions TAB SAB Ect Mult Living   3 3 3       2      Review of Systems  Constitutional: Negative for fever and chills.  Musculoskeletal: Negative for myalgias.  Skin: Positive for wound. Negative for rash.      Allergies  Amoxicillin; Cephalexin; and Morphine and related  Home Medications   Prior to Admission medications   Medication Sig Start Date End Date Taking? Authorizing Provider  buprenorphine-naloxone (SUBOXONE) 2-0.5 MG SUBL SL tablet Place 2 tablets under the tongue daily.   Yes Historical Provider, MD  clindamycin (CLEOCIN) 150  MG capsule Take 2 capsules (300 mg total) by mouth 3 (three) times daily. 11/07/13  Yes Tatyana A Kirichenko, PA-C  clonazePAM (KLONOPIN) 1 MG tablet Take 1 mg by mouth 2 (two) times daily.   Yes Historical Provider, MD  ibuprofen (ADVIL,MOTRIN) 800 MG tablet Take 1 tablet (800 mg total) by mouth 3 (three) times daily. 11/07/13  Yes Tatyana A Kirichenko, PA-C  oxyCODONE-acetaminophen (PERCOCET) 5-325 MG per tablet Take 1 tablet by mouth every 4 (four) hours as needed for severe pain. 11/07/13  Yes Tatyana A Kirichenko, PA-C  promethazine (PHENERGAN) 25 MG tablet Take 1 tablet (25 mg total) by mouth every 6 (six) hours as needed for nausea or vomiting. 11/07/13  Yes Tatyana A Kirichenko, PA-C   BP 121/71  Pulse 80  Temp(Src) 97.9 F (36.6 C) (Oral)  Resp 16  Ht 5\' 5"  (1.651 m)  SpO2 100%  LMP 10/02/2013 Physical Exam  Constitutional: She is oriented to person, place, and time. She appears well-developed and well-nourished. No distress.  HENT:  Head: Normocephalic and atraumatic.  Eyes: Conjunctivae are normal. No scleral icterus.  Neck: Normal range of motion.  Cardiovascular: Normal rate, regular rhythm and normal heart sounds.  Exam reveals no gallop and no friction rub.   No murmur heard. Pulmonary/Chest: Effort normal and breath sounds normal. No respiratory distress.  Abdominal: Soft. Bowel sounds are normal. She exhibits no distension and no mass. There  is no tenderness. There is no guarding.  Neurological: She is alert and oriented to person, place, and time.  Skin: Skin is warm and dry. She is not diaphoretic.  Right upper extremity with grip in wound status post IND. Packing is removed, there is about 2 cm of induration and erythematous tissue without heat. No purulent discharge.    ED Course  Procedures (including critical care time) Labs Review Labs Reviewed - No data to display  Imaging Review No results found.   EKG Interpretation None     Patient packing removed, the  wound was flushed with about 10 cc of normal saline. About 3 cm of one quarter inch iodoform gauze was reinserted into the wound. MD 4 M   Final diagnoses:  Wound check, abscess    M. patient wound appears to be improving. Her wound culture report showed Staphylococcus. Sensitivities were available at this time. I did reinsert some packing as they're still large gaping wound in the front of her arm. Patient is instructed to remove packing at home in about 48 hours. She may return to the emergency department for any signs of worsening infection or systemic symptoms such as fever, chills, myalgias. The patient appears reasonably screened and/or stabilized for discharge and I doubt any other medical condition or other [facility] requiring further screening, evaluation, or treatment in the ED at this time prior to discharge.    Arthor CaptainAbigail Nani Ingram, PA-C 11/09/13 [phone]

## 2013-11-09 NOTE — Discharge Instructions (Signed)
Wound Check  Your wound appears healthy today. Your wound will heal gradually over time. Eventually a scar will form that will fade with time.  FACTORS THAT AFFECT SCAR FORMATION:   People differ in the severity in which they scar.   Scar severity varies according to location, size, and the traits you inherited from your parents (genetic predisposition).   Irritation to the wound from infection, rubbing, or chemical exposure will increase the amount of scar formation.  HOME CARE INSTRUCTIONS    If you were given a dressing, you should change it at least once a day or as instructed by your caregiver. If the bandage sticks, soak it off with a solution of hydrogen peroxide.   If the bandage becomes wet, dirty, or develops a bad smell, change it as soon as possible.   Look for signs of infection.   Only take over-the-counter or prescription medicines for pain, discomfort, or fever as directed by your caregiver.  SEEK IMMEDIATE MEDICAL CARE IF:    You have redness, swelling, or increasing pain in the wound.   You notice pus coming from the wound.   You have a fever.   You notice a bad smell coming from the wound or dressing.  Document Released: 03/27/2004 Document Revised: 09/13/2011 Document Reviewed: 06/21/2005  [employer] Patient Information 2014 [employer].

## 2013-11-09 NOTE — ED Notes (Signed)
Pt here to get wound to rechecked that she had drained two days ago , wound has packing in it ,no drainage , is hard to palpation around site

## 2013-11-10 ENCOUNTER — Telehealth (HOSPITAL_BASED_OUTPATIENT_CLINIC_OR_DEPARTMENT_OTHER): Payer: Self-pay | Admitting: Emergency Medicine

## 2013-11-10 LAB — WOUND CULTURE: Special Requests: NORMAL

## 2013-11-10 NOTE — Telephone Encounter (Signed)
Lab called + wound culture MRSA. Treated with Clindamycin, sensitive to same. Attempt to contact and notify patient, no answer

## 2013-11-11 NOTE — Telephone Encounter (Signed)
Patient notified of + MRSA  and treatment given with RX Clindamycin. Infection control instructions provided.

## 2013-11-11 NOTE — Telephone Encounter (Signed)
Post ED Visit - Positive Culture Follow-up  Culture report reviewed by antimicrobial stewardship pharmacist: []  Wes UniontownDulaney, Pharm.D., BCPS [x]  Celedonio MiyamotoJeremy Frens, Pharm.D., BCPS []  Georgina PillionElizabeth Martin, Pharm.D., BCPS []  MainevilleMinh Pham, Vermont[place]arm.D., BCPS, AAHIVP []  Estella HuskMichelle Turner, Pharm.D., BCPS, AAHIVP []  Harvie JuniorNathan Cope, Pharm.D.  Positive wound culture Treated with Clindamycin, organism sensitive to the same and no further patient follow-up is required at this time.  Cristian Davitt 11/11/2013, 9:59 AM

## 2013-11-12 NOTE — ED Provider Notes (Signed)
Medical screening examination/treatment/procedure(s) were performed by non-physician practitioner and as supervising physician I was immediately available for consultation/collaboration.   EKG Interpretation None       Juliet RudeNathan R. Rubin PayorPickering, MD 11/12/13 1651

## 2014-05-06 ENCOUNTER — Encounter (HOSPITAL_COMMUNITY): Payer: Self-pay | Admitting: Emergency Medicine

## 2014-10-04 ENCOUNTER — Emergency Department (HOSPITAL_COMMUNITY)
Admission: EM | Admit: 2014-10-04 | Discharge: 2014-10-05 | Disposition: A | Discharge status: Home / Self Care | Payer: Medicaid Other | Attending: Emergency Medicine | Admitting: Emergency Medicine

## 2014-10-04 ENCOUNTER — Encounter (HOSPITAL_COMMUNITY): Payer: Self-pay | Admitting: *Deleted

## 2014-10-04 DIAGNOSIS — F329 Major depressive disorder, single episode, unspecified: Secondary | ICD-10-CM | POA: Diagnosis not present

## 2014-10-04 DIAGNOSIS — Z8719 Personal history of other diseases of the digestive system: Secondary | ICD-10-CM | POA: Diagnosis not present

## 2014-10-04 DIAGNOSIS — Z720 Tobacco use: Secondary | ICD-10-CM | POA: Insufficient documentation

## 2014-10-04 DIAGNOSIS — R519 Headache, unspecified: Secondary | ICD-10-CM

## 2014-10-04 DIAGNOSIS — F1310 Sedative, hypnotic or anxiolytic abuse, uncomplicated: Secondary | ICD-10-CM | POA: Insufficient documentation

## 2014-10-04 DIAGNOSIS — F1110 Opioid abuse, uncomplicated: Secondary | ICD-10-CM | POA: Diagnosis not present

## 2014-10-04 DIAGNOSIS — F419 Anxiety disorder, unspecified: Secondary | ICD-10-CM | POA: Insufficient documentation

## 2014-10-04 DIAGNOSIS — Z8619 Personal history of other infectious and parasitic diseases: Secondary | ICD-10-CM | POA: Diagnosis not present

## 2014-10-04 DIAGNOSIS — R51 Headache: Secondary | ICD-10-CM | POA: Diagnosis not present

## 2014-10-04 DIAGNOSIS — Z3202 Encounter for pregnancy test, result negative: Secondary | ICD-10-CM | POA: Insufficient documentation

## 2014-10-04 DIAGNOSIS — Z880 Allergy status to penicillin: Secondary | ICD-10-CM | POA: Insufficient documentation

## 2014-10-04 DIAGNOSIS — R112 Nausea with vomiting, unspecified: Secondary | ICD-10-CM | POA: Diagnosis present

## 2014-10-04 LAB — POC URINE PREG, ED: PREG TEST UR: NEGATIVE

## 2014-10-04 MED ORDER — DIPHENHYDRAMINE HCL 50 MG/ML IJ SOLN
12.5000 mg | Freq: Once | INTRAMUSCULAR | Status: AC
  Administered 2014-10-04: 12.5 mg via INTRAVENOUS
  Filled 2014-10-04: qty 1

## 2014-10-04 MED ORDER — ONDANSETRON 4 MG PO TBDP
4.0000 mg | ORAL_TABLET | Freq: Once | ORAL | Status: AC
  Administered 2014-10-04: 4 mg via ORAL
  Filled 2014-10-04: qty 1

## 2014-10-04 MED ORDER — OXYCODONE-ACETAMINOPHEN 5-325 MG PO TABS
1.0000 | ORAL_TABLET | Freq: Once | ORAL | Status: AC
  Administered 2014-10-04: 1 via ORAL
  Filled 2014-10-04: qty 1

## 2014-10-04 MED ORDER — METOCLOPRAMIDE HCL 5 MG/ML IJ SOLN
10.0000 mg | Freq: Once | INTRAMUSCULAR | Status: AC
  Administered 2014-10-04: 10 mg via INTRAVENOUS
  Filled 2014-10-04: qty 2

## 2014-10-04 MED ORDER — SODIUM CHLORIDE 0.9 % IV BOLUS (SEPSIS)
1000.0000 mL | Freq: Once | INTRAVENOUS | Status: AC
  Administered 2014-10-04: 1000 mL via INTRAVENOUS

## 2014-10-04 NOTE — ED Notes (Signed)
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

## 2014-10-04 NOTE — ED Provider Notes (Signed)
CSN: [id number]     Arrival date & time 10/04/14  1812 History   First MD Initiated Contact with Patient 10/04/14 2236     Chief Complaint  Patient presents with  . Migraine  . Nausea     (Consider location/radiation/quality/duration/timing/severity/associated sxs/prior Treatment) The history is provided by the patient. No language interpreter was used.  Karen Salas is a 29 y/o F with PMHx of anxiety, mental disorder, gonorrhea, chlamydia, hepatitis presenting to the ED with headache that has been ongoing intermittently for the past 2 days. Patient reported that the pain is a constant throbbing pain localized to the right forehead. Reported that the headache started off mild progressive got worse throughout the day. Stated that she has been feeling nauseous with at least 3 episodes of emesis today mainly of food contents-NB/NB. Reported that she has history of similar headaches in the past, reported that she normally comes to the ED to be treated and is normally treated with morphine. Patient stated that she has been having some mild sinus congestion. 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 Denied use of heroin, cocaine, marijuana, alcohol use. Denied fever, neck pain, neck stiffness, blurred vision, sudden loss of vision, chest pain, shortness of breath, difficulty breathing, abdominal pain, loss of sensation, numbness, tingling, head injury, cough, nasal congestion, confusion, disorientation. PCP Dr. Toni Arthurs  Past Medical History  Diagnosis Date  . Anxiety   . Mental disorder   . Depression   . Hx of chlamydia infection   . Hx of gonorrhea   . Hepatitis     has the antibody but not disease   Past Surgical History  Procedure Laterality Date  . Wisdom tooth extraction     History reviewed. No pertinent family history. History  Substance Use Topics  . Smoking status: Current Every Day Smoker -- 1.00 packs/day for 10 years    Types: Cigarettes  . Smokeless  tobacco: Not on file  . Alcohol Use: No     Comment: denied alcohol use   OB History    Gravida Para Term Preterm AB TAB SAB Ectopic Multiple Living   3 3 3       2      Review of Systems  Constitutional: Negative for fever and chills.  Eyes: Negative for visual disturbance.  Respiratory: Negative for chest tightness and shortness of breath.   Cardiovascular: Negative for chest pain.  Gastrointestinal: Positive for nausea and vomiting. Negative for abdominal pain.  Musculoskeletal: Negative for back pain, neck pain and neck stiffness.  Neurological: Positive for headaches. Negative for dizziness, weakness and numbness.      Allergies  Amoxicillin; Cephalexin; and Morphine and related  Home Medications   Prior to Admission medications   Medication Sig Start Date End Date Taking? Authorizing Provider  ALPRAZolam Prudy Feeler) 1 MG tablet Take 1 mg by mouth 3 (three) times daily.   Yes Historical Provider, MD  QUEtiapine (SEROQUEL XR) 400 MG 24 hr tablet Take 400 mg by mouth at bedtime.   Yes Historical Provider, MD  clindamycin (CLEOCIN) 150 MG capsule Take 2 capsules (300 mg total) by mouth 3 (three) times daily. Patient not taking: Reported on 10/04/2014 11/07/13   Tatyana Kirichenko, PA-C  ibuprofen (ADVIL,MOTRIN) 800 MG tablet Take 1 tablet (800 mg total) by mouth 3 (three) times daily. Patient not taking: Reported on 10/04/2014 11/07/13   Jaynie Crumble, PA-C  oxyCODONE-acetaminophen (PERCOCET) 5-325 MG per tablet Take 1 tablet by mouth every 4 (four) hours as  needed for severe pain. Patient not taking: Reported on 10/04/2014 11/07/13   Jaynie Crumble, PA-C  promethazine (PHENERGAN) 25 MG tablet Take 1 tablet (25 mg total) by mouth every 6 (six) hours as needed for nausea or vomiting. Patient not taking: Reported on 10/04/2014 11/07/13   Tatyana Kirichenko, PA-C   BP 112/78 mmHg  Pulse 72  Temp(Src) 98 F (36.7 C) (Oral)  Resp 16  SpO2 97%  LMP  Physical Exam  Constitutional: She  is oriented to person, place, and time. She appears well-developed and well-nourished. No distress.  HENT:  Head: Normocephalic and atraumatic.  Mouth/Throat: Oropharynx is clear and moist. No oropharyngeal exudate.  Eyes: Conjunctivae and EOM are normal. Pupils are equal, round, and reactive to light. Right eye exhibits no discharge. Left eye exhibits no discharge.  Neck: Normal range of motion. Neck supple. No tracheal deviation present.  Negative neck stiffness Negative nuchal rigidity  Negative cervical lymphadenopathy  Negative meningeal signs  Cardiovascular: Normal rate, regular rhythm and normal heart sounds.  Exam reveals no friction rub.   No murmur heard. Pulmonary/Chest: Effort normal and breath sounds normal. No respiratory distress. She has no wheezes. She has no rales.  Musculoskeletal: Normal range of motion.  Full ROM to upper and lower extremities without difficulty noted, negative ataxia noted.  Lymphadenopathy:    She has no cervical adenopathy.  Neurological: She is alert and oriented to person, place, and time.  Cranial nerves III-XII grossly intact Strength 5+/5+ to upper and lower extremities bilaterally with resistance applied, equal distribution noted Patient is able to bring finger to nose bilaterally without difficulty or ataxia Equal grip strength bilaterally  Negative facial droop  Negative slurred speech  Negative aphasia Negative arm drift Fine motor skills intact Patient follows commands well  Patient responds to questions appropriately   Skin: Skin is warm and dry. No rash noted. She is not diaphoretic. No erythema.  Psychiatric: She has a normal mood and affect. Her behavior is normal. Thought content normal.  Nursing note and vitals reviewed.   ED Course  Procedures (including critical care time)  Results for orders placed or performed during the hospital encounter of 10/04/14  CBC with Differential/Platelet  Result Value Ref Range   WBC  16.1 (H) 4.0 - 10.5 K/uL   RBC 4.12 3.87 - 5.11 MIL/uL   Hemoglobin 13.4 12.0 - 15.0 g/dL   HCT 40.9 81.1 - 91.4 %   MCV 94.2 78.0 - 100.0 fL   MCH 32.5 26.0 - 34.0 pg   MCHC 34.5 30.0 - 36.0 g/dL   RDW 78.2 95.6 - 21.3 %   Platelets 388 150 - 400 K/uL   Neutrophils Relative % 68 43 - 77 %   Neutro Abs 10.9 (H) 1.7 - 7.7 K/uL   Lymphocytes Relative 24 12 - 46 %   Lymphs Abs 3.8 0.7 - 4.0 K/uL   Monocytes Relative 7 3 - 12 %   Monocytes Absolute 1.1 (H) 0.1 - 1.0 K/uL   Eosinophils Relative 0 0 - 5 %   Eosinophils Absolute 0.1 0.0 - 0.7 K/uL   Basophils Relative 1 0 - 1 %   Basophils Absolute 0.1 0.0 - 0.1 K/uL  Comprehensive metabolic panel  Result Value Ref Range   Sodium 138 135 - 145 mmol/L   Potassium 3.5 3.5 - 5.1 mmol/L   Chloride 108 96 - 112 mmol/L   CO2 21 19 - 32 mmol/L   Glucose, Bld 95 70 - 99 mg/dL  BUN 11 6 - 23 mg/dL   Creatinine, Ser 1.30 0.50 - 1.10 mg/dL   Calcium 9.2 8.4 - 86.5 mg/dL   Total Protein 7.9 6.0 - 8.3 g/dL   Albumin 4.1 3.5 - 5.2 g/dL   AST 19 0 - 37 U/L   ALT 14 0 - 35 U/L   Alkaline Phosphatase 148 (H) 39 - 117 U/L   Total Bilirubin 0.5 0.3 - 1.2 mg/dL   GFR calc non Af Amer >90 >90 mL/min   GFR calc Af Amer >90 >90 mL/min   Anion gap 9 5 - 15  Urine rapid drug screen (hosp performed)  Result Value Ref Range   Opiates POSITIVE (A) NONE DETECTED   Cocaine NONE DETECTED NONE DETECTED   Benzodiazepines POSITIVE (A) NONE DETECTED   Amphetamines NONE DETECTED NONE DETECTED   Tetrahydrocannabinol NONE DETECTED NONE DETECTED   Barbiturates NONE DETECTED NONE DETECTED  POC urine preg, ED (not at [facility])  Result Value Ref Range   Preg Test, Ur NEGATIVE NEGATIVE    Labs Review Labs Reviewed  CBC WITH DIFFERENTIAL/PLATELET - Abnormal; Notable for the following:    WBC 16.1 (*)    Neutro Abs 10.9 (*)    Monocytes Absolute 1.1 (*)    All other components within normal limits  COMPREHENSIVE METABOLIC PANEL - Abnormal; Notable for the  following:    Alkaline Phosphatase 148 (*)    All other components within normal limits  URINE RAPID DRUG SCREEN (HOSP PERFORMED) - Abnormal; Notable for the following:    Opiates POSITIVE (*)    Benzodiazepines POSITIVE (*)    All other components within normal limits  POC URINE PREG, ED    Imaging Review Ct Head Wo Contrast  10/05/2014   CLINICAL DATA:  Migraine headache with nausea for 3 days.  EXAM: CT HEAD WITHOUT CONTRAST  TECHNIQUE: Contiguous axial images were obtained from the base of the skull through the vertex without intravenous contrast.  COMPARISON:  05/02/2012  FINDINGS: There is no intracranial hemorrhage, mass or evidence of acute infarction. Gray matter and white matter are normal. The ventricles and basal cisterns appear unremarkable.  The bony structures are intact. The visible portions of the paranasal sinuses are clear.  IMPRESSION: Normal brain   Electronically Signed   By: Ellery Plunk M.D.   On: 10/05/2014 01:27     EKG Interpretation None      MDM   Final diagnoses:  None    Medications  ketorolac (TORADOL) 30 MG/ML injection 30 mg (not administered)  sodium chloride 0.9 % bolus 1,000 mL (not administered)  oxyCODONE-acetaminophen (PERCOCET/ROXICET) 5-325 MG per tablet 1 tablet (1 tablet Oral Given 10/04/14 1839)  ondansetron (ZOFRAN-ODT) disintegrating tablet 4 mg (4 mg Oral Given 10/04/14 1839)  sodium chloride 0.9 % bolus 1,000 mL (0 mLs Intravenous Stopped 10/05/14 0057)  metoCLOPramide (REGLAN) injection 10 mg (10 mg Intravenous Given 10/04/14 2342)  diphenhydrAMINE (BENADRYL) injection 12.5 mg (12.5 mg Intravenous Given 10/04/14 2345)    Filed Vitals:   10/04/14 2300 10/04/14 2330 10/05/14 0000 10/05/14 0045  BP: 121/84 115/75 116/75 112/78  Pulse: 89 85 86 72  Temp:      TempSrc:      Resp:    16  SpO2: 96% 91% 98% 97%   CBC noted elevated WBC of 16.1. Hemoglobin 13.4, hematocrit 30.8. CMP unremarkable-probably elevated alkaline phosphatase of  148. Urine drug screen positive for opiates, benzos. Urine pregnancy negative. CT head without contrast unremarkable. Negative focal neurological  deficits noted. Doubt SAH. Doubt ICH. Doubt meningitis. Suspicion to be typical migraine-patient reported that she has history of migraine in the past with the same symptoms-denied new symptoms are worsening features.  Pain medications given in the ED setting. [facility]-C at change in shift.   Raymon MuttonMarissa Lenetta Piche, PA-C 10/05/14 0221  Dione Boozeavid Glick, MD 10/07/14 [phone]

## 2014-10-04 NOTE — ED Notes (Signed)
Patient reports 3 days hx of migraine headache with nausea. Pt tearful and appears to be uncomfortable in triage.

## 2014-10-05 ENCOUNTER — Emergency Department (HOSPITAL_COMMUNITY): Payer: Medicaid Other

## 2014-10-05 LAB — RAPID URINE DRUG SCREEN, HOSP PERFORMED
Amphetamines: NOT DETECTED
BARBITURATES: NOT DETECTED
BENZODIAZEPINES: POSITIVE — AB
COCAINE: NOT DETECTED
OPIATES: POSITIVE — AB
Tetrahydrocannabinol: NOT DETECTED

## 2014-10-05 LAB — COMPREHENSIVE METABOLIC PANEL
ALBUMIN: 4.1 g/dL (ref 3.5–5.2)
ALT: 14 U/L (ref 0–35)
AST: 19 U/L (ref 0–37)
Alkaline Phosphatase: 148 U/L — ABNORMAL HIGH (ref 39–117)
Anion gap: 9 (ref 5–15)
BILIRUBIN TOTAL: 0.5 mg/dL (ref 0.3–1.2)
BUN: 11 mg/dL (ref 6–23)
CHLORIDE: 108 mmol/L (ref 96–112)
CO2: 21 mmol/L (ref 19–32)
CREATININE: 0.65 mg/dL (ref 0.50–1.10)
Calcium: 9.2 mg/dL (ref 8.4–10.5)
GFR calc Af Amer: >90 mL/min (ref >90)
Glucose, Bld: 95 mg/dL (ref 70–99)
Potassium: 3.5 mmol/L (ref 3.5–5.1)
Sodium: 138 mmol/L (ref 135–145)
Total Protein: 7.9 g/dL (ref 6.0–8.3)

## 2014-10-05 LAB — CBC WITH DIFFERENTIAL/PLATELET
BASOS PCT: 1 % (ref 0–1)
Basophils Absolute: 0.1 10*3/uL (ref 0.0–0.1)
EOS PCT: 0 % (ref 0–5)
Eosinophils Absolute: 0.1 10*3/uL (ref 0.0–0.7)
HEMATOCRIT: 38.8 % (ref 36.0–46.0)
HEMOGLOBIN: 13.4 g/dL (ref 12.0–15.0)
LYMPHS ABS: 3.8 10*3/uL (ref 0.7–4.0)
LYMPHS PCT: 24 % (ref 12–46)
MCH: 32.5 pg (ref 26.0–34.0)
MCHC: 34.5 g/dL (ref 30.0–36.0)
MCV: 94.2 fL (ref 78.0–100.0)
MONO ABS: 1.1 10*3/uL — AB (ref 0.1–1.0)
Monocytes Relative: 7 % (ref 3–12)
NEUTROS PCT: 68 % (ref 43–77)
Neutro Abs: 10.9 10*3/uL — ABNORMAL HIGH (ref 1.7–7.7)
PLATELETS: 388 10*3/uL (ref 150–400)
RBC: 4.12 MIL/uL (ref 3.87–5.11)
RDW: 12.5 % (ref 11.5–15.5)
WBC: 16.1 10*3/uL — ABNORMAL HIGH (ref 4.0–10.5)

## 2014-10-05 MED ORDER — ONDANSETRON HCL 4 MG/2ML IJ SOLN
4.0000 mg | Freq: Once | INTRAMUSCULAR | Status: AC
  Administered 2014-10-05: 4 mg via INTRAVENOUS
  Filled 2014-10-05: qty 2

## 2014-10-05 MED ORDER — ONDANSETRON 4 MG PO TBDP: 4.0000 mg | ORAL_TABLET | Freq: Three times a day (TID) | ORAL | Status: DC | PRN

## 2014-10-05 MED ORDER — KETOROLAC TROMETHAMINE 30 MG/ML IJ SOLN
30.0000 mg | Freq: Once | INTRAMUSCULAR | Status: AC
  Administered 2014-10-05: 30 mg via INTRAVENOUS
  Filled 2014-10-05: qty 1

## 2014-10-05 MED ORDER — BUTALBITAL-APAP-CAFFEINE 50-325-40 MG PO TABS: 1.0000 | ORAL_TABLET | Freq: Once | ORAL | Status: DC

## 2014-10-05 MED ORDER — HYDROMORPHONE HCL 1 MG/ML IJ SOLN
2.0000 mg | Freq: Once | INTRAMUSCULAR | Status: AC
  Administered 2014-10-05: 1 mg via INTRAVENOUS
  Filled 2014-10-05: qty 2

## 2014-10-05 MED ORDER — SODIUM CHLORIDE 0.9 % IV BOLUS (SEPSIS)
1000.0000 mL | Freq: Once | INTRAVENOUS | Status: AC
  Administered 2014-10-05: 1000 mL via INTRAVENOUS

## 2014-10-05 MED ORDER — BUTALBITAL-APAP-CAFFEINE 50-325-40 MG PO TABS
1.0000 | ORAL_TABLET | Freq: Once | ORAL | Status: AC
  Administered 2014-10-05: 1 via ORAL
  Filled 2014-10-05: qty 1

## 2014-10-05 MED ORDER — HALOPERIDOL LACTATE 5 MG/ML IJ SOLN
5.0000 mg | Freq: Once | INTRAMUSCULAR | Status: DC
  Filled 2014-10-05: qty 1

## 2014-10-05 NOTE — ED Notes (Signed)
Second mg of dilaudid given.

## 2014-10-05 NOTE — Discharge Instructions (Signed)
Please follow up with your primary care physician in 1-2 days. If you do not have one please call the [facility] number listed above. Please read all discharge instructions and return precautions.  ° °Migraine Headache °A migraine headache is an intense, throbbing pain on one or both sides of your head. A migraine can last for 30 minutes to several hours. °CAUSES  °The exact cause of a migraine headache is not always known. However, a migraine may be caused when nerves in the brain become irritated and release chemicals that cause inflammation. This causes pain. °Certain things may also trigger migraines, such as: °· Alcohol. °· Smoking. °· Stress. °· Menstruation. °· Aged cheeses. °· Foods or drinks that contain nitrates, glutamate, aspartame, or tyramine. °· Lack of sleep. °· Chocolate. °· Caffeine. °· Hunger. °· Physical exertion. °· Fatigue. °· Medicines used to treat chest pain (nitroglycerine), birth control pills, estrogen, and some blood pressure medicines. °SIGNS AND SYMPTOMS °· Pain on one or both sides of your head. °· Pulsating or throbbing pain. °· Severe pain that prevents daily activities. °· Pain that is aggravated by any physical activity. °· Nausea, vomiting, or both. °· Dizziness. °· Pain with exposure to bright lights, loud noises, or activity. °· General sensitivity to bright lights, loud noises, or smells. °Before you get a migraine, you may get warning signs that a migraine is coming (aura). An aura may include: °· Seeing flashing lights. °· Seeing bright spots, halos, or zigzag lines. °· Having tunnel vision or blurred vision. °· Having feelings of numbness or tingling. °· Having trouble talking. °· Having muscle weakness. °DIAGNOSIS  °A migraine headache is often diagnosed based on: °· Symptoms. °· Physical exam. °· A CT scan or MRI of your head. These imaging tests cannot diagnose migraines, but they can help rule out other causes of headaches. °TREATMENT °Medicines may  be given for pain and nausea. Medicines can also be given to help prevent recurrent migraines.  °HOME CARE INSTRUCTIONS °· Only take over-the-counter or prescription medicines for pain or discomfort as directed by your health care provider. The use of long-term narcotics is not recommended. °· Lie down in a dark, quiet room when you have a migraine. °· Keep a journal to find out what may trigger your migraine headaches. For example, write down: °¨ What you eat and drink. °¨ How much sleep you get. °¨ Any change to your diet or medicines. °· Limit alcohol consumption. °· Quit smoking if you smoke. °· Get 7-9 hours of sleep, or as recommended by your health care provider. °· Limit stress. °· Keep lights dim if bright lights bother you and make your migraines worse. °SEEK IMMEDIATE MEDICAL CARE IF:  °· Your migraine becomes severe. °· You have a fever. °· You have a stiff neck. °· You have vision loss. °· You have muscular weakness or loss of muscle control. °· You start losing your balance or have trouble walking. °· You feel faint or pass out. °· You have severe symptoms that are different from your first symptoms. °MAKE SURE YOU:  °· Understand these instructions. °· Will watch your condition. °· Will get help right away if you are not doing well or get worse. °Document Released: 06/21/2005 Document Revised: 11/05/2013 Document Reviewed: 02/26/2013 °[employer]® Patient Information ©2015 [employer]. This information is not intended to replace advice given to you by your health care provider. Make sure you discuss any questions you have with your health care provider. ° °

## 2014-10-05 NOTE — ED Notes (Signed)
Patient transported to CT 

## 2014-10-05 NOTE — Progress Notes (Signed)
ED CM received call from [facility] Pharmacist Ana concerning  Fioricet prescription frequency clarification. Discussed with L Allyne GeeSanders PA-C EDP clarification on  frequency is  every 8 hours as needed for migraine headaches. Clarification provided, no further needs identified.

## 2014-10-05 NOTE — ED Notes (Signed)
Pt called out reporting pain. Piepenbrink, PA notified.

## 2014-10-05 NOTE — ED Notes (Signed)
[place], PA notified that the pt is still having pain.

## 2014-10-05 NOTE — ED Provider Notes (Signed)
Patient care acquired from [facility], PA-C pending re-evaluation.  Results for orders placed or performed during the hospital encounter of 10/04/14  CBC with Differential/Platelet  Result Value Ref Range   WBC 16.1 (H) 4.0 - 10.5 K/uL   RBC 4.12 3.87 - 5.11 MIL/uL   Hemoglobin 13.4 12.0 - 15.0 g/dL   HCT 66.4 40.3 - 47.4 %   MCV 94.2 78.0 - 100.0 fL   MCH 32.5 26.0 - 34.0 pg   MCHC 34.5 30.0 - 36.0 g/dL   RDW 25.9 56.3 - 87.5 %   Platelets 388 150 - 400 K/uL   Neutrophils Relative % 68 43 - 77 %   Neutro Abs 10.9 (H) 1.7 - 7.7 K/uL   Lymphocytes Relative 24 12 - 46 %   Lymphs Abs 3.8 0.7 - 4.0 K/uL   Monocytes Relative 7 3 - 12 %   Monocytes Absolute 1.1 (H) 0.1 - 1.0 K/uL   Eosinophils Relative 0 0 - 5 %   Eosinophils Absolute 0.1 0.0 - 0.7 K/uL   Basophils Relative 1 0 - 1 %   Basophils Absolute 0.1 0.0 - 0.1 K/uL  Comprehensive metabolic panel  Result Value Ref Range   Sodium 138 135 - 145 mmol/L   Potassium 3.5 3.5 - 5.1 mmol/L   Chloride 108 96 - 112 mmol/L   CO2 21 19 - 32 mmol/L   Glucose, Bld 95 70 - 99 mg/dL   BUN 11 6 - 23 mg/dL   Creatinine, Ser 6.43 0.50 - 1.10 mg/dL   Calcium 9.2 8.4 - 32.9 mg/dL   Total Protein 7.9 6.0 - 8.3 g/dL   Albumin 4.1 3.5 - 5.2 g/dL   AST 19 0 - 37 U/L   ALT 14 0 - 35 U/L   Alkaline Phosphatase 148 (H) 39 - 117 U/L   Total Bilirubin 0.5 0.3 - 1.2 mg/dL   GFR calc non Af Amer >90 >90 mL/min   GFR calc Af Amer >90 >90 mL/min   Anion gap 9 5 - 15  Urine rapid drug screen (hosp performed)  Result Value Ref Range   Opiates POSITIVE (A) NONE DETECTED   Cocaine NONE DETECTED NONE DETECTED   Benzodiazepines POSITIVE (A) NONE DETECTED   Amphetamines NONE DETECTED NONE DETECTED   Tetrahydrocannabinol NONE DETECTED NONE DETECTED   Barbiturates NONE DETECTED NONE DETECTED  POC urine preg, ED ([facility])  Result Value Ref Range   Preg Test, Ur NEGATIVE NEGATIVE   Ct Head Wo Contrast  10/05/2014   CLINICAL DATA:  Migraine headache  with nausea for 3 days.  EXAM: CT HEAD WITHOUT CONTRAST  TECHNIQUE: Contiguous axial images were obtained from the base of the skull through the vertex without intravenous contrast.  COMPARISON:  05/02/2012  FINDINGS: There is no intracranial hemorrhage, mass or evidence of acute infarction. Gray matter and white matter are normal. The ventricles and basal cisterns appear unremarkable.  The bony structures are intact. The visible portions of the paranasal sinuses are clear.  IMPRESSION: Normal brain   Electronically Signed   By: Ellery Plunk M.D.   On: 10/05/2014 01:27    1. Bad headache     Filed Vitals:   10/05/14 0500  BP: 114/67  Pulse: 75  Temp:   Resp:    Afebrile, NAD, non-toxic appearing, AAOx4.  Pt HA treated and improved while in ED.  Presentation is non concerning for [facility], ICH, Meningitis, or temporal arteritis. Pt is afebrile with no focal neuro  deficits, nuchal rigidity, or change in vision. Pt is to follow up with PCP to discuss prophylactic medication. Pt verbalizes understanding and is agreeable with plan to dc. Patient is stable at time of discharge    Francee PiccoloJennifer Xoie Kreuser, PA-C 10/05/14 [id number]  Layla MawKristen N Ward, DO 10/05/14 [phone]

## 2014-10-05 NOTE — ED Notes (Signed)
Pt given ginger ale.

## 2015-04-15 ENCOUNTER — Emergency Department (HOSPITAL_COMMUNITY): Payer: Medicaid Other

## 2015-04-15 ENCOUNTER — Emergency Department (HOSPITAL_COMMUNITY)
Admission: EM | Admit: 2015-04-15 | Discharge: 2015-04-16 | Disposition: A | Discharge status: Home / Self Care | Payer: Medicaid Other | Attending: Emergency Medicine | Admitting: Emergency Medicine

## 2015-04-15 ENCOUNTER — Encounter (HOSPITAL_COMMUNITY): Payer: Self-pay | Admitting: Emergency Medicine

## 2015-04-15 DIAGNOSIS — R000 Tachycardia, unspecified: Secondary | ICD-10-CM | POA: Insufficient documentation

## 2015-04-15 DIAGNOSIS — R1032 Left lower quadrant pain: Secondary | ICD-10-CM | POA: Insufficient documentation

## 2015-04-15 DIAGNOSIS — B349 Viral infection, unspecified: Secondary | ICD-10-CM

## 2015-04-15 DIAGNOSIS — Z79899 Other long term (current) drug therapy: Secondary | ICD-10-CM | POA: Insufficient documentation

## 2015-04-15 DIAGNOSIS — Z880 Allergy status to penicillin: Secondary | ICD-10-CM | POA: Diagnosis not present

## 2015-04-15 DIAGNOSIS — Z720 Tobacco use: Secondary | ICD-10-CM | POA: Diagnosis not present

## 2015-04-15 DIAGNOSIS — M791 Myalgia: Secondary | ICD-10-CM | POA: Diagnosis not present

## 2015-04-15 DIAGNOSIS — R112 Nausea with vomiting, unspecified: Secondary | ICD-10-CM | POA: Diagnosis present

## 2015-04-15 DIAGNOSIS — F329 Major depressive disorder, single episode, unspecified: Secondary | ICD-10-CM | POA: Diagnosis not present

## 2015-04-15 DIAGNOSIS — Z8619 Personal history of other infectious and parasitic diseases: Secondary | ICD-10-CM | POA: Insufficient documentation

## 2015-04-15 DIAGNOSIS — F419 Anxiety disorder, unspecified: Secondary | ICD-10-CM | POA: Diagnosis not present

## 2015-04-15 DIAGNOSIS — D72829 Elevated white blood cell count, unspecified: Secondary | ICD-10-CM | POA: Diagnosis not present

## 2015-04-15 DIAGNOSIS — R109 Unspecified abdominal pain: Secondary | ICD-10-CM

## 2015-04-15 LAB — COMPREHENSIVE METABOLIC PANEL
ALT: 44 U/L (ref 14–54)
AST: 47 U/L — AB (ref 15–41)
Albumin: 3.5 g/dL (ref 3.5–5.0)
Alkaline Phosphatase: 107 U/L (ref 38–126)
Anion gap: 10 (ref 5–15)
BILIRUBIN TOTAL: 0.4 mg/dL (ref 0.3–1.2)
BUN: 8 mg/dL (ref 6–20)
CALCIUM: 9.2 mg/dL (ref 8.9–10.3)
CO2: 23 mmol/L (ref 22–32)
CREATININE: 0.62 mg/dL (ref 0.44–1.00)
Chloride: 104 mmol/L (ref 101–111)
GFR calc Af Amer: >60 mL/min (ref >60)
Glucose, Bld: 94 mg/dL (ref 65–99)
POTASSIUM: 4.3 mmol/L (ref 3.5–5.1)
Sodium: 137 mmol/L (ref 135–145)
Total Protein: 7 g/dL (ref 6.5–8.1)

## 2015-04-15 LAB — URINALYSIS, ROUTINE W REFLEX MICROSCOPIC
BILIRUBIN URINE: NEGATIVE
Glucose, UA: NEGATIVE mg/dL
Hgb urine dipstick: NEGATIVE
KETONES UR: NEGATIVE mg/dL
Leukocytes, UA: NEGATIVE
Nitrite: NEGATIVE
Protein, ur: NEGATIVE mg/dL
Specific Gravity, Urine: 1.019 (ref 1.005–1.030)
UROBILINOGEN UA: 0.2 mg/dL (ref 0.0–1.0)
pH: 8.5 — ABNORMAL HIGH (ref 5.0–8.0)

## 2015-04-15 LAB — I-STAT BETA HCG BLOOD, ED (MC, WL, AP ONLY)

## 2015-04-15 LAB — I-STAT CG4 LACTIC ACID, ED
Lactic Acid, Venous: 1.34 mmol/L (ref 0.5–2.0)
Lactic Acid, Venous: 1.72 mmol/L (ref 0.5–2.0)

## 2015-04-15 LAB — CBC
HEMATOCRIT: 41.4 % (ref 36.0–46.0)
Hemoglobin: 14.3 g/dL (ref 12.0–15.0)
MCH: 31.9 pg (ref 26.0–34.0)
MCHC: 34.5 g/dL (ref 30.0–36.0)
MCV: 92.4 fL (ref 78.0–100.0)
PLATELETS: 266 10*3/uL (ref 150–400)
RBC: 4.48 MIL/uL (ref 3.87–5.11)
RDW: 12.2 % (ref 11.5–15.5)
WBC: 12.3 10*3/uL — AB (ref 4.0–10.5)

## 2015-04-15 LAB — LIPASE, BLOOD: Lipase: 24 U/L (ref 22–51)

## 2015-04-15 MED ORDER — KETOROLAC TROMETHAMINE 30 MG/ML IJ SOLN
30.0000 mg | Freq: Once | INTRAMUSCULAR | Status: AC
  Administered 2015-04-15: 30 mg via INTRAVENOUS
  Filled 2015-04-15: qty 1

## 2015-04-15 MED ORDER — DIPHENHYDRAMINE HCL 50 MG/ML IJ SOLN: 25.0000 mg | Freq: Once | INTRAMUSCULAR | Status: DC

## 2015-04-15 MED ORDER — CYCLOBENZAPRINE HCL 10 MG PO TABS
10.0000 mg | ORAL_TABLET | Freq: Once | ORAL | Status: AC
  Administered 2015-04-15: 10 mg via ORAL
  Filled 2015-04-15: qty 1

## 2015-04-15 MED ORDER — SODIUM CHLORIDE 0.9 % IV BOLUS (SEPSIS)
1000.0000 mL | Freq: Once | INTRAVENOUS | Status: AC
  Administered 2015-04-15: 1000 mL via INTRAVENOUS

## 2015-04-15 MED ORDER — SODIUM CHLORIDE 0.9 % IV BOLUS (SEPSIS)
1000.0000 mL | Freq: Once | INTRAVENOUS | Status: AC
  Administered 2015-04-16: 1000 mL via INTRAVENOUS

## 2015-04-15 MED ORDER — ONDANSETRON HCL 4 MG/2ML IJ SOLN
4.0000 mg | Freq: Once | INTRAMUSCULAR | Status: AC
  Administered 2015-04-15: 4 mg via INTRAVENOUS
  Filled 2015-04-15: qty 2

## 2015-04-15 MED ORDER — MORPHINE SULFATE (PF) 4 MG/ML IV SOLN
4.0000 mg | Freq: Once | INTRAVENOUS | Status: AC
  Administered 2015-04-15: 4 mg via INTRAVENOUS
  Filled 2015-04-15: qty 1

## 2015-04-15 NOTE — ED Provider Notes (Signed)
CSN: [id number]     Arrival date & time 04/15/15  1543 History   First MD Initiated Contact with Patient 04/15/15 2009     Chief Complaint  Patient presents with  . Generalized Body Aches  . Emesis     (Consider location/radiation/quality/duration/timing/severity/associated sxs/prior Treatment) HPI Comments: Patient is a 29 year old female who presents with multiple symptoms starting this morning. Patient reports gradual onset of generalized body aches, subjective fever, and nausea/vomiting that started this morning. She has not tried anything for symptoms. No aggravating/alleviating factors. No known sick contacts.    Past Medical History  Diagnosis Date  . Anxiety   . Mental disorder   . Depression   . Hx of chlamydia infection   . Hx of gonorrhea   . Hepatitis     has the antibody but not disease   Past Surgical History  Procedure Laterality Date  . Wisdom tooth extraction     History reviewed. No pertinent family history. Social History  Substance Use Topics  . Smoking status: Current Every Day Smoker -- 1.00 packs/day for 10 years    Types: Cigarettes  . Smokeless tobacco: None  . Alcohol Use: No     Comment: denied alcohol use   OB History    Gravida Para Term Preterm AB TAB SAB Ectopic Multiple Living   Review of Systems  Constitutional: Positive for fever.  Gastrointestinal: Positive for nausea and vomiting.  Genitourinary: Positive for flank pain.  Musculoskeletal: Positive for myalgias.  All other systems reviewed and are negative.     Allergies  Amoxicillin; Cephalexin; and Morphine and related  Home Medications   Prior to Admission medications   Medication Sig Start Date End Date Taking? Authorizing Provider  ALPRAZolam Prudy Feeler) 1 MG tablet Take 1 mg by mouth 3 (three) times daily.    Historical Provider, MD  butalbital-acetaminophen-caffeine (FIORICET, ESGIC) 50-325-40 MG per tablet Take 1 tablet by mouth once. 10/05/14    Jennifer Piepenbrink, PA-C  clindamycin (CLEOCIN) 150 MG capsule Take 2 capsules (300 mg total) by mouth 3 (three) times daily. Patient not taking: Reported on 10/04/2014 11/07/13   Tatyana Kirichenko, PA-C  ibuprofen (ADVIL,MOTRIN) 800 MG tablet Take 1 tablet (800 mg total) by mouth 3 (three) times daily. Patient not taking: Reported on 10/04/2014 11/07/13   Tatyana Kirichenko, PA-C  ondansetron (ZOFRAN ODT) 4 MG disintegrating tablet Take 1 tablet (4 mg total) by mouth every 8 (eight) hours as needed for nausea or vomiting. 10/05/14   Jennifer Piepenbrink, PA-C  oxyCODONE-acetaminophen (PERCOCET) 5-325 MG per tablet Take 1 tablet by mouth every 4 (four) hours as needed for severe pain. Patient not taking: Reported on 10/04/2014 11/07/13   Jaynie Crumble, PA-C  promethazine (PHENERGAN) 25 MG tablet Take 1 tablet (25 mg total) by mouth every 6 (six) hours as needed for nausea or vomiting. Patient not taking: Reported on 10/04/2014 11/07/13   Jaynie Crumble, PA-C  QUEtiapine (SEROQUEL XR) 400 MG 24 hr tablet Take 400 mg by mouth at bedtime.    Historical Provider, MD   BP 123/74 mmHg  Pulse 121  Temp(Src) 99.7 F (37.6 C) (Oral)  Resp 18  SpO2 95% Physical Exam  Constitutional: She is oriented to person, place, and time. She appears well-developed and well-nourished. No distress.  HENT:  Head: Normocephalic and atraumatic.  Eyes: Conjunctivae and EOM are normal.  Neck: Normal range of motion.  Cardiovascular: Normal rate  and regular rhythm.  Exam reveals no gallop and no friction rub.   No murmur heard. Pulmonary/Chest: Effort normal and breath sounds normal. She has no wheezes. She has no rales. She exhibits no tenderness.  Abdominal: Soft. She exhibits no distension. There is no tenderness. There is no rebound.  Genitourinary:  Left CVA tenderness.   Musculoskeletal: Normal range of motion.  Neurological: She is alert and oriented to person, place, and time. Coordination normal.  Speech is  goal-oriented. Moves limbs without ataxia.   Skin: Skin is warm and dry.  Psychiatric: She has a normal mood and affect. Her behavior is normal.  Nursing note and vitals reviewed.   ED Course  Procedures (including critical care time) Labs Review Labs Reviewed  COMPREHENSIVE METABOLIC PANEL - Abnormal; Notable for the following:    AST 47 (*)    All other components within normal limits  CBC - Abnormal; Notable for the following:    WBC 12.3 (*)    All other components within normal limits  URINALYSIS, ROUTINE W REFLEX MICROSCOPIC (NOT AT [facility]) - Abnormal; Notable for the following:    pH 8.5 (*)    All other components within normal limits  LIPASE, BLOOD  I-STAT BETA HCG BLOOD, ED ([facility], [facility], [facility] ONLY)  I-STAT CG4 LACTIC ACID, ED  I-STAT CG4 LACTIC ACID, ED    Imaging Review Ct Abdomen Pelvis Wo Contrast  04/15/2015   CLINICAL DATA:  Low back pain and nausea and vomiting, onset today. Left flank pain.  EXAM: CT ABDOMEN AND PELVIS WITHOUT CONTRAST  TECHNIQUE: Multidetector CT imaging of the abdomen and pelvis was performed following the standard protocol without IV contrast.  COMPARISON:  None.  FINDINGS: Atelectasis in the lung bases.  Kidneys appear symmetrical in size and shape. No hydronephrosis or hydroureter. No renal, ureteral, or bladder stones. Bladder wall is not thickened.  The unenhanced appearance of the liver, spleen, gallbladder, pancreas, adrenal glands, abdominal aorta, inferior vena cava, and retroperitoneal lymph nodes is unremarkable. Stomach and small bowel are decompressed. Diffusely stool-filled colon without abnormal distention. No free air or free fluid in the abdomen.  Pelvis: Uterus and ovaries are not enlarged. Appendix is normal. No free or loculated pelvic fluid collections. No pelvic mass or lymphadenopathy. Stool-filled rectosigmoid colon without inflammatory change. No destructive bone lesions.  IMPRESSION: No renal or ureteral stone or obstruction.    Electronically Signed   By: Burman Nieves M.D.   On: 04/15/2015 23:20   I have personally reviewed and evaluated these images and lab results as part of my medical decision-making.   EKG Interpretation None      MDM   Final diagnoses:  Viral illness  Left flank pain    8:22 PM Patient's labs show elevated WBC at 12.3. Vitals show low grade temp and tachycardia. Urinalysis pending. Patient will have fluids, toradol, and zofran for symptoms.    11:55 PM CT shows no acute changes. Patient will have additional fluids and flexeril for back pain.   Patient feeling better and will be discharged with symptomatic treatment. Patient likely has a viral illness. She is instructed to return with worsening or concerning symptoms.   Emilia Beck, PA-C 04/16/15 7829  Linwood Dibbles, MD 04/17/15 2204

## 2015-04-15 NOTE — ED Notes (Signed)
Pt sts N/V and body aches with fever starting this am

## 2015-04-16 MED ORDER — PROMETHAZINE HCL 25 MG PO TABS: 25.0000 mg | ORAL_TABLET | Freq: Four times a day (QID) | ORAL | Status: DC | PRN

## 2015-04-16 MED ORDER — MELOXICAM 15 MG PO TABS: 15.0000 mg | ORAL_TABLET | Freq: Every day | ORAL | Status: DC

## 2015-04-16 NOTE — Discharge Instructions (Signed)
Take mobic as needed for pain. Take Phenergan as needed for nausea and vomiting. Refer to attached documents for more information.

## 2016-06-16 ENCOUNTER — Encounter (HOSPITAL_COMMUNITY): Payer: Self-pay | Admitting: Emergency Medicine

## 2016-06-16 ENCOUNTER — Emergency Department (HOSPITAL_COMMUNITY)
Admission: EM | Admit: 2016-06-16 | Discharge: 2016-06-16 | Disposition: A | Discharge status: Home / Self Care | Payer: Medicaid Other | Attending: Emergency Medicine | Admitting: Emergency Medicine

## 2016-06-16 DIAGNOSIS — R51 Headache: Secondary | ICD-10-CM | POA: Diagnosis present

## 2016-06-16 DIAGNOSIS — Z5321 Procedure and treatment not carried out due to patient leaving prior to being seen by health care provider: Secondary | ICD-10-CM | POA: Diagnosis not present

## 2016-06-16 DIAGNOSIS — F17210 Nicotine dependence, cigarettes, uncomplicated: Secondary | ICD-10-CM | POA: Insufficient documentation

## 2016-06-16 LAB — CBC
HCT: 42 % (ref 36.0–46.0)
Hemoglobin: 14.8 g/dL (ref 12.0–15.0)
MCH: 32.7 pg (ref 26.0–34.0)
MCHC: 35.2 g/dL (ref 30.0–36.0)
MCV: 92.9 fL (ref 78.0–100.0)
Platelets: 499 10*3/uL — ABNORMAL HIGH (ref 150–400)
RBC: 4.52 MIL/uL (ref 3.87–5.11)
RDW: 12.2 % (ref 11.5–15.5)
WBC: 13.5 10*3/uL — ABNORMAL HIGH (ref 4.0–10.5)

## 2016-06-16 LAB — I-STAT BETA HCG BLOOD, ED (MC, WL, AP ONLY): I-stat hCG, quantitative: <5 m[IU]/mL (ref <5)

## 2016-06-16 LAB — BASIC METABOLIC PANEL
Anion gap: 13 (ref 5–15)
BUN: 10 mg/dL (ref 6–20)
CO2: 19 mmol/L — ABNORMAL LOW (ref 22–32)
Calcium: 9.9 mg/dL (ref 8.9–10.3)
Chloride: 107 mmol/L (ref 101–111)
Creatinine, Ser: 0.9 mg/dL (ref 0.44–1.00)
GFR calc Af Amer: >60 mL/min (ref >60)
GFR calc non Af Amer: >60 mL/min (ref >60)
Glucose, Bld: 82 mg/dL (ref 65–99)
Potassium: 3.7 mmol/L (ref 3.5–5.1)
Sodium: 139 mmol/L (ref 135–145)

## 2016-06-16 NOTE — ED Triage Notes (Signed)
Pt to ED with significant other after pt had seizure-like activity at home lasting a few seconds with full body convulsions, no incontinence, no oral trauma . Pt crying in triage, c/o severe headache and vomiting. Pt reports having two other seizures before, is not on medications. Pt actively vomiting

## 2016-08-08 ENCOUNTER — Emergency Department (HOSPITAL_COMMUNITY)
Admission: EM | Admit: 2016-08-08 | Discharge: 2016-08-08 | Discharge status: Absent without leave | Payer: Medicaid Other

## 2016-08-08 ENCOUNTER — Ambulatory Visit (HOSPITAL_COMMUNITY)
Admission: EM | Admit: 2016-08-08 | Discharge: 2016-08-08 | Disposition: A | Discharge status: Home / Self Care | Payer: Medicaid Other | Attending: Family Medicine | Admitting: Family Medicine

## 2016-08-08 ENCOUNTER — Encounter (HOSPITAL_COMMUNITY): Payer: Self-pay | Admitting: Emergency Medicine

## 2016-08-08 DIAGNOSIS — L02411 Cutaneous abscess of right axilla: Secondary | ICD-10-CM

## 2016-08-08 MED ORDER — SULFAMETHOXAZOLE-TRIMETHOPRIM 800-160 MG PO TABS: 1.0000 | ORAL_TABLET | Freq: Two times a day (BID) | ORAL | 0 refills | Status: AC

## 2016-08-08 MED ORDER — HYDROCODONE-ACETAMINOPHEN 5-325 MG PO TABS: 1.0000 | ORAL_TABLET | Freq: Four times a day (QID) | ORAL | 0 refills | Status: DC | PRN

## 2016-08-08 NOTE — ED Triage Notes (Signed)
[facility] a complaint of an abscess under her right arm x 1 week.

## 2016-08-08 NOTE — Discharge Instructions (Signed)
I have prescribed bactrim as an antibiotic for your abscess. Take 1 tablet twice a day for 7 days. In 24 hours, change your wound dressing and remove the packing, redress the wound. Return to clinic in 48 hours for re-check of your wound. For pain, I have prescribed Norco 5 mg. You do have an allergy to morphine but you have verbalized you have not had any reaction to hydrocodone in the past. If you do have a reaction, stop taking the medicine and go to the emergency department. This medicine is a narcotic, it is addictive, and it will cause drowsiness. Do not drink alcohol or drive while taking this medicine. I would also advise you not to take your Xanax while taking this medicine as it can cause severe respiratory depression. Should you have any complications, return to clinic or go to the ER.

## 2016-08-08 NOTE — ED Provider Notes (Signed)
CSN: [id number]     Arrival date & time 08/08/16  1457 History   None    Chief Complaint  Patient presents with  . Abscess   (Consider location/radiation/quality/duration/timing/severity/associated sxs/prior Treatment) 31 year old female presents with one week history of abscess right axillary area. States the swelling has been getting larger and more painful, it has not drained yet, no fever, nausea, vomiting, or other systemic symptoms.   The history is provided by the patient.    Past Medical History:  Diagnosis Date  . Anxiety   . Depression   . Hepatitis    has the antibody but not disease  . Hx of chlamydia infection   . Hx of gonorrhea   . Mental disorder    Past Surgical History:  Procedure Laterality Date  . WISDOM TOOTH EXTRACTION     History reviewed. No pertinent family history. Social History  Substance Use Topics  . Smoking status: Current Every Day Smoker    Packs/day: 1.00    Years: 10.00    Types: Cigarettes  . Smokeless tobacco: Never Used  . Alcohol use No     Comment: denied alcohol use   OB History    Gravida Para Term Preterm AB Living   3 3 3     2    SAB TAB Ectopic Multiple Live Births           2     Review of Systems  Reason unable to perform ROS: as covered in HPI.  All other systems reviewed and are negative.   Allergies  Amoxicillin; Cephalexin; and Morphine and related  Home Medications   Prior to Admission medications   Medication Sig Start Date End Date Taking? Authorizing Provider  ALPRAZolam Prudy Feeler(XANAX) 1 MG tablet Take 1 mg by mouth 3 (three) times daily.   Yes Historical Provider, MD  QUEtiapine (SEROQUEL) 300 MG tablet Take 600 mg by mouth at bedtime.   Yes Historical Provider, MD  HYDROcodone-acetaminophen (NORCO/VICODIN) 5-325 MG tablet Take 1-2 tablets by mouth every 6 (six) hours as needed for moderate pain. 08/08/16   Dorena BodoLawrence Jasiel Apachito, NP  sulfamethoxazole-trimethoprim (BACTRIM DS,SEPTRA DS) 800-160 MG tablet Take 1  tablet by mouth 2 (two) times daily. 08/08/16 08/15/16  Dorena BodoLawrence Yudit Modesitt, NP   Meds Ordered and Administered this Visit  Medications - No data to display  BP 114/68 (BP Location: Right Arm)   Pulse 97   Temp 98.2 F (36.8 C) (Oral)   Resp 16   SpO2 97%  No data found.   Physical Exam  Constitutional: She is oriented to person, place, and time. She appears well-developed and well-nourished. No distress.  HENT:  Head: Normocephalic and atraumatic.  Right Ear: External ear normal.  Left Ear: External ear normal.  Neck: Normal range of motion. Neck supple.  Lymphadenopathy:    She has no cervical adenopathy.    She has axillary adenopathy.       Right axillary: Lateral adenopathy present.  Neurological: She is alert and oriented to person, place, and time.  Skin: Skin is warm and dry. Capillary refill takes less than 2 seconds. She is not diaphoretic.  3x4 cm, red swollen abscess, right axillary region, no visible overlying cellulitis.  Psychiatric: She has a normal mood and affect.  Nursing note and vitals reviewed.   Urgent Care Course     .[name].Incision and Drainage Date/Time: 08/08/2016 8:47 PM Performed by: Dorena BodoKENNARD, Yvette Roark Authorized by: Elvina SidleLAUENSTEIN, KURT   Consent:    Consent obtained:  Verbal   Consent given by:  Patient   Risks discussed:  Bleeding, incomplete drainage, pain and infection   Alternatives discussed:  No treatment and delayed treatment Location:    Type:  Abscess   Size:  4 cm   Location:  Trunk   Trunk location: right axillary. Pre-procedure details:    Skin preparation:  Betadine Anesthesia (see MAR for exact dosages):    Anesthesia method:  Local infiltration   Local anesthetic:  Lidocaine 2% WITH epi Procedure type:    Complexity:  Simple Procedure details:    Needle aspiration: no     Incision types:  Single straight   Incision depth:  Subcutaneous   Scalpel blade:  11   Wound management:  Probed and deloculated, irrigated with saline and  extensive cleaning   Drainage:  Bloody and purulent   Drainage amount:  Moderate   Wound treatment:  Wound left open   Packing materials:  1/4 in gauze   Amount 1/4":  3 cm Post-procedure details:    Patient tolerance of procedure:  Tolerated with difficulty   (including critical care time)  Labs Review Labs Reviewed - No data to display  Imaging Review No results found.   Visual Acuity Review  Right Eye Distance:   Left Eye Distance:   Bilateral Distance:    Right Eye Near:   Left Eye Near:    Bilateral Near:         MDM   1. Abscess of right axilla   I have prescribed bactrim as an antibiotic for your abscess. Take 1 tablet twice a day for 7 days. In 24 hours, change your wound dressing and remove the packing, redress the wound. Return to clinic in 48 hours for re-check of your wound. For pain, I have prescribed Norco 5 mg. You do have an allergy to morphine but you have verbalized you have not had any reaction to hydrocodone in the past. If you do have a reaction, stop taking the medicine and go to the emergency department. This medicine is a narcotic, it is addictive, and it will cause drowsiness. Do not drink alcohol or drive while taking this medicine. I would also advise you not to take your Xanax while taking this medicine as it can cause severe respiratory depression. Should you have any complications, return to clinic or go to the ER.  [employer] [place] Controlled Substances Registry Consulted. Patient gets monthly supply of Xanax from her PCP, no record of opioid narcotics in last 6 month      Dorena Bodo, NP 08/08/16 2050

## 2016-08-11 ENCOUNTER — Emergency Department (HOSPITAL_COMMUNITY)
Admission: EM | Admit: 2016-08-11 | Discharge: 2016-08-11 | Discharge status: Against Medical Advice | Payer: Medicaid Other

## 2016-08-11 NOTE — ED Notes (Signed)
No answer x1

## 2016-08-11 NOTE — ED Notes (Signed)
No answer in waiting area.

## 2016-08-11 NOTE — ED Notes (Signed)
No answer in waiting room 

## 2016-08-12 ENCOUNTER — Encounter (HOSPITAL_COMMUNITY): Payer: Self-pay | Admitting: Emergency Medicine

## 2016-08-12 ENCOUNTER — Emergency Department (HOSPITAL_COMMUNITY)
Admission: EM | Admit: 2016-08-12 | Discharge: 2016-08-12 | Disposition: A | Discharge status: Home / Self Care | Payer: Medicaid Other | Attending: Emergency Medicine | Admitting: Emergency Medicine

## 2016-08-12 DIAGNOSIS — L02413 Cutaneous abscess of right upper limb: Secondary | ICD-10-CM | POA: Diagnosis present

## 2016-08-12 DIAGNOSIS — Z5189 Encounter for other specified aftercare: Secondary | ICD-10-CM

## 2016-08-12 DIAGNOSIS — F17210 Nicotine dependence, cigarettes, uncomplicated: Secondary | ICD-10-CM | POA: Insufficient documentation

## 2016-08-12 DIAGNOSIS — L0291 Cutaneous abscess, unspecified: Secondary | ICD-10-CM

## 2016-08-12 MED ORDER — LIDOCAINE-EPINEPHRINE (PF) 2 %-1:200000 IJ SOLN
10.0000 mL | Freq: Once | INTRAMUSCULAR | Status: AC
  Administered 2016-08-12: 10 mL
  Filled 2016-08-12: qty 20

## 2016-08-12 MED ORDER — HYDROCODONE-ACETAMINOPHEN 5-325 MG PO TABS
1.0000 | ORAL_TABLET | Freq: Once | ORAL | Status: AC
  Administered 2016-08-12: 1 via ORAL
  Filled 2016-08-12: qty 1

## 2016-08-12 NOTE — Discharge Instructions (Signed)
Please schedule appointment with general surgery immediately regarding today's visit and visit to urgent care. Please use warm compress to the area and remove packing in 24 hours. Keep area dry and clean. Continue taking your Bactrim antibiotic and continue your pain medication at home and that was prescribed to you 4 days ago. He can also take Motrin or Aleve until you're seen by general surgery.  Contact a health care provider if: You have more redness, swelling, or pain around your abscess. You have more fluid or blood coming from your abscess. Your abscess feels warm to the touch. You have more pus or a bad smell coming from your abscess. You have a fever. You have muscle aches. You have chills or a general ill feeling. Get help right away if: You have severe pain. You see red streaks on your skin spreading away from the abscess.

## 2016-08-12 NOTE — ED Provider Notes (Signed)
[facility]-EMERGENCY DEPT Provider Note   CSN: [id number] Arrival date & time: 08/12/16  [id number]  By signing my name below, I, Majel HomerPeyton Lee, attest that this documentation has been prepared under the direction and in the presence of Yony Roulston, [place] . Electronically Signed: Majel HomerPeyton Lee, Scribe. 08/12/2016. 9:44 AM.  History   Chief Complaint Chief Complaint  Patient presents with  . Wound Check   The history is provided by the patient. No language interpreter was used.   HPI Comments: Karen Salas is a 31 y.o. female who presents to the Emergency Department requesting a wound re-check to her right axilla s/p an incision and drainage procedure 4 days ago. Pt [facility] for a possible abscess in which she had it drained and was advised to return to the clinic in 2 days for a re-check. She notes she was told of "multiple underlying abscesses" during this visit as well but states they were not drained at that time. She reports she visited the ED again today for her persistent pain and drainage from the site that began last night. She states she has been taking her Bactrim as prescribed and also used BC powder last night for her pain with no relief. Pt also complains of gradually worsening, chills and headache that began 2 days ago. She denies any fever. 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   Past Medical History:  Diagnosis Date  . Anxiety   . Depression   . Hepatitis    has the antibody but not disease  . Hx of chlamydia infection   . Hx of gonorrhea   . Mental disorder    There are no active problems to display for this patient.  Past Surgical History:  Procedure Laterality Date  . WISDOM TOOTH EXTRACTION      OB History    Gravida Para Term Preterm AB Living   3 3 3     2    SAB TAB Ectopic Multiple Live Births           2     Home Medications    Prior to Admission medications   Medication Sig Start  Date End Date Taking? Authorizing Provider  ALPRAZolam Prudy Feeler(XANAX) 1 MG tablet Take 1 mg by mouth 3 (three) times daily.    Historical Provider, MD  HYDROcodone-acetaminophen (NORCO/VICODIN) 5-325 MG tablet Take 1-2 tablets by mouth every 6 (six) hours as needed for moderate pain. 08/08/16   Dorena BodoLawrence Kennard, NP  QUEtiapine (SEROQUEL) 300 MG tablet Take 600 mg by mouth at bedtime.    Historical Provider, MD  sulfamethoxazole-trimethoprim (BACTRIM DS,SEPTRA DS) 800-160 MG tablet Take 1 tablet by mouth 2 (two) times daily. 08/08/16 08/15/16  Dorena BodoLawrence Kennard, NP    Family History History reviewed. No pertinent family history.  Social History Social History  Substance Use Topics  . Smoking status: Current Every Day Smoker    Packs/day: 1.00    Years: 10.00    Types: Cigarettes  . Smokeless tobacco: Never Used  . Alcohol use No     Comment: denied alcohol use     Allergies   Amoxicillin; Cephalexin; and Morphine and related   Review of Systems Review of Systems  Constitutional: Positive for chills. Negative for fever.  Skin:       +abscess to right axilla with active drainage  Neurological: Positive for headaches.   Physical Exam Updated Vital Signs  BP 116/75 (BP Location: Left Arm)   Pulse 93   Temp 97.7 F (36.5 C) (Oral)   Resp 19   SpO2 96%   Physical Exam  Constitutional: She is oriented to person, place, and time. She appears well-developed and well-nourished.  HENT:  Head: Normocephalic.  Eyes: EOM are normal.  Neck: Normal range of motion.  Cardiovascular: Normal rate and normal heart sounds.   No murmur heard. No murmur auscultated.  Pulmonary/Chest: Effort normal and breath sounds normal. No respiratory distress.  Normal work of breathing. No respiratory distress.  Abdominal: She exhibits no distension.  Musculoskeletal: Normal range of motion.  Neurological: She is alert and oriented to person, place, and time.  Skin: There is erythema.  Mild erythema and  swelling to right axilla with tough texture with site of visible red and yellow discharge seeping out, very likely abscess. No visible overlying cellulitis. No visible signs of puncture sites.   Psychiatric: She has a normal mood and affect.  Nursing note and vitals reviewed.  ED Treatments / Results  DIAGNOSTIC STUDIES:  Oxygen Saturation is 96% on RA, normal by my interpretation.    COORDINATION OF CARE:  9:32 AM Discussed treatment plan with pt at bedside and pt agreed to plan.  Labs (all labs ordered are listed, but only abnormal results are displayed) Labs Reviewed - No data to display  EKG  EKG Interpretation None       Radiology No results found.  Procedures .Marland KitchenIncision and Drainage Date/Time: 08/12/2016 11:10 AM Performed by: Alvina Chou Authorized by: Alvina Chou   Consent:    Consent obtained:  Verbal   Consent given by:  Patient   Risks discussed:  Bleeding, incomplete drainage, infection and pain   Alternatives discussed:  No treatment and delayed treatment Location:    Type:  Abscess   Size:  4 cm   Location:  Upper extremity   Upper extremity location:  Arm   Arm location:  R upper arm (right axilla) Pre-procedure details:    Skin preparation:  Betadine Anesthesia (see MAR for exact dosages):    Anesthesia method:  Local infiltration   Local anesthetic:  Lidocaine 2% WITH epi Procedure type:    Complexity:  Complex Procedure details:    Needle aspiration: no     Incision types:  Stab incision and single straight   Incision depth:  Dermal   Scalpel blade:  11   Wound management:  Probed and deloculated and irrigated with saline   Drainage:  Purulent and serosanguinous   Drainage amount:  Moderate   Wound treatment:  Wound left open   Packing materials:  1/4 in gauze   Amount 1/4":  5 cm Post-procedure details:    Patient tolerance of procedure:  Procedure terminated at patient's request Comments:     Intolerable  pain. Not able to fully explore loculations due to pain and discomfort. Appears to still have incomplete drainage. Able to pack dressing to area and leave it open for further drainage.  Previous I&D had similar outcome with incomplete drainage and intolerable pain, according to patient.       Medications Ordered in ED Medications  lidocaine-EPINEPHrine (XYLOCAINE W/EPI) 2 %-1:200000 (PF) injection 10 mL (10 mLs Infiltration Given by Other 08/12/16 1057)  HYDROcodone-acetaminophen (NORCO/VICODIN) 5-325 MG per tablet 1 tablet (1 tablet Oral Given 08/12/16 1131)    Initial Impression / Assessment and Plan / ED Course  I have reviewed the triage vital signs and the nursing  notes.  Pertinent labs & imaging results that were available during my care of the patient were reviewed by me and considered in my medical decision making (see chart for details).     Patient returns for check of cellulitis, recent incision and drainage. Still having pain and discomfort.  The region appears to have some apparent discharge, swelling, and TTP. Appears abscess was not fully drained 4 days ago. No surrounding erythema, afebrile. Intolerable pain during today's I&D. Not able to fully explore loculations due to pain and discomfort, and asking to stop. Appears to still have incomplete drainage. Previous I&D had similar outcome with incomplete drainage and intolerable pain, according to patient and partner. Provider who performed I&D 4 days ago also stated on his note that patient tolerated procedure with difficulty. 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 No murmur auscultated.  Patient symptoms not improved from prior visit. Afebrile and hemodynamically stable. Pt is instructed to continue with home care and antibiotics. Patient given strict instructions to schedule appointment today to referred general surgery for further evaluation and complete drainage. Pt has a good understanding of return precautions and  given instructions for reasons for immediate return, and is safe for discharge at this time.   I personally performed the services described in this documentation, which was scribed in my presence. The recorded information has been reviewed and is accurate.  Final Clinical Impressions(s) / ED Diagnoses   Final diagnoses:  Abscess  Visit for wound check    New Prescriptions Discharge Medication List as of 08/12/2016 12:12 PM       [address] [place], [place] 08/12/16 [address] [place], [place] 08/12/16 1740    Lavera Guise, MD 08/12/16 1800

## 2016-08-12 NOTE — ED Notes (Signed)
PT also c/o headache for several days unreleived with BC powder

## 2016-08-12 NOTE — ED Triage Notes (Signed)
Pt here for wound check to right axillary area; pt had drained on the 4th; pt sts continued pain and some body aches

## 2017-05-29 IMAGING — CT CT ABD-PELV W/O CM
2 of 4 series · 10 of 46 positions shown, 11 images · non-contrast
Comparison: None.

CLINICAL DATA: Low back pain and nausea and vomiting, onset today.
Left flank pain.

EXAM:
CT ABDOMEN AND PELVIS WITHOUT CONTRAST
TECHNIQUE: Multidetector CT imaging of the abdomen and pelvis was performed
following the standard protocol without IV contrast.

[Series 201: stone study, idose (2) · axial · 0.79mm/px · z∈[-41,+374]mm · 7 of 101 slices shown, 8 images]
[im 9/101  soft-tissue]
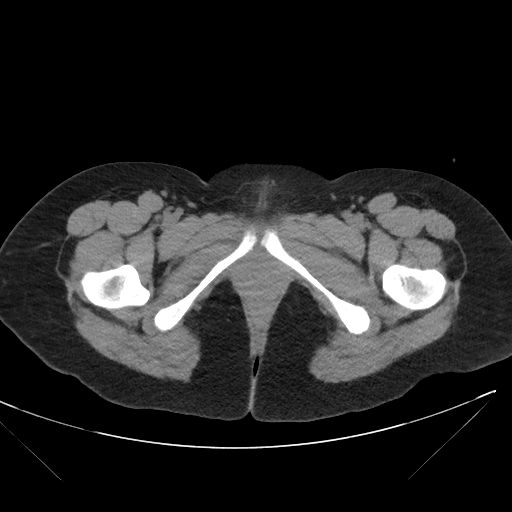
[im 9/101  bone]
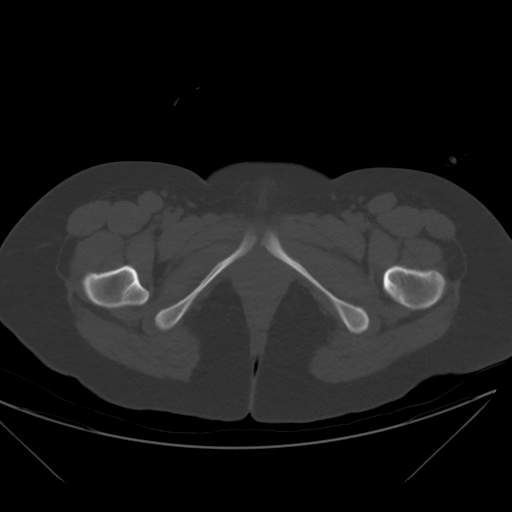
[im 21/101  soft-tissue]
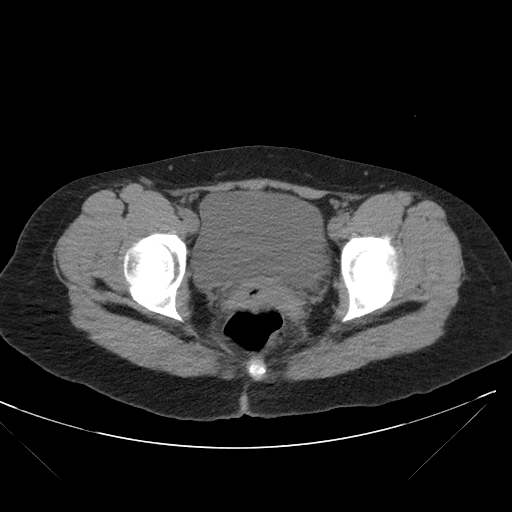
[im 38/101  soft-tissue]
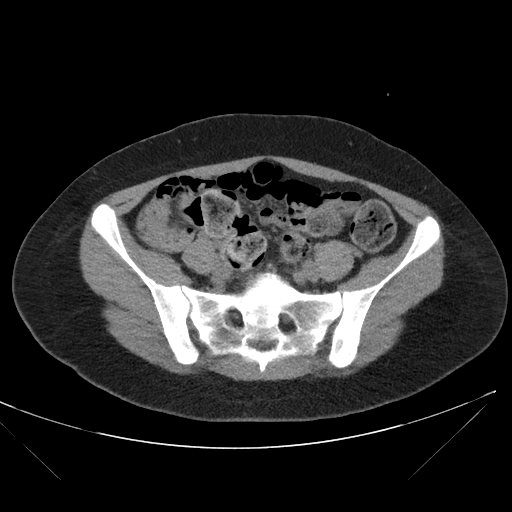
[im 51/101  soft-tissue]
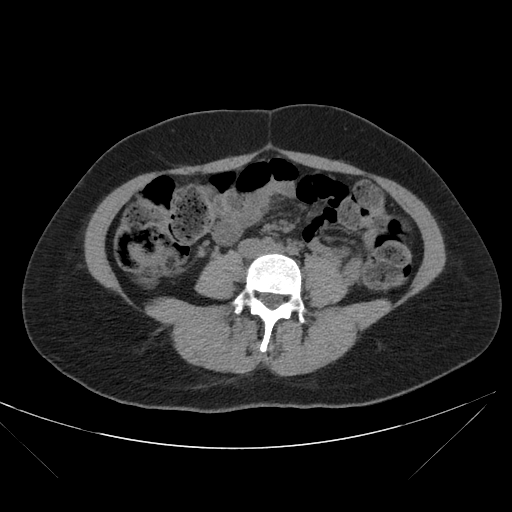
[im 63/101  soft-tissue]
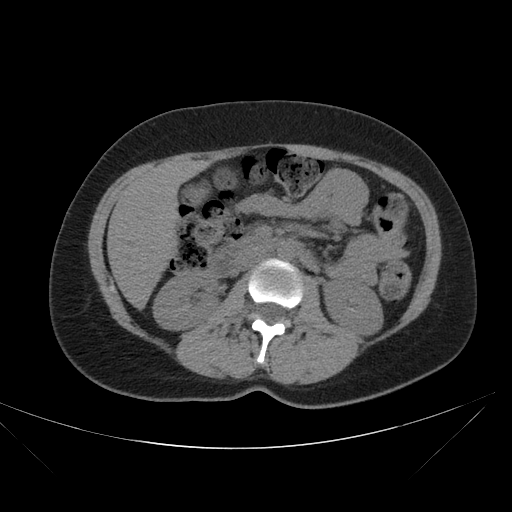
[im 80/101  soft-tissue]
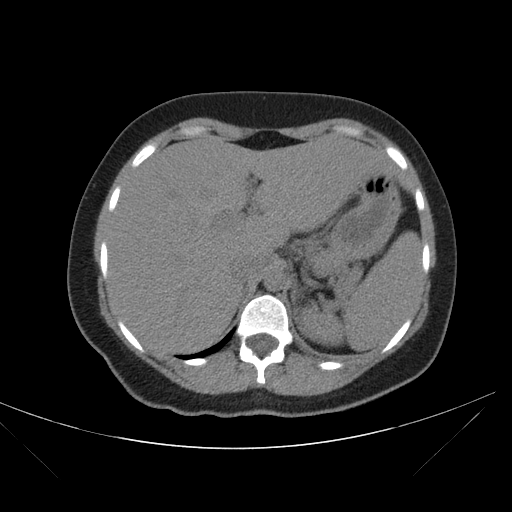
[im 92/101  soft-tissue]
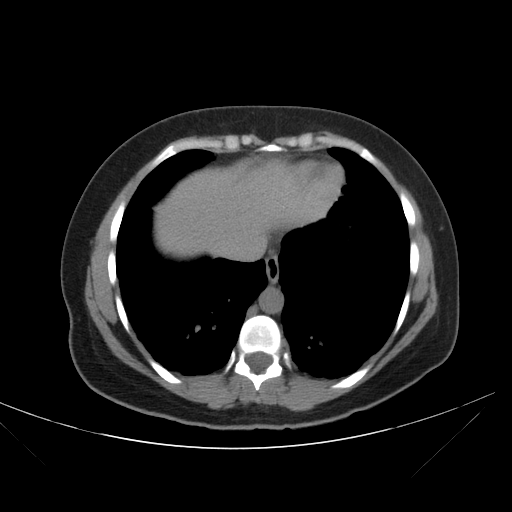

[Series 203: coronals, idose (3) · coronal · 0.45mm/px · 3 of 112 slices shown]
[im 38/112  soft-tissue]
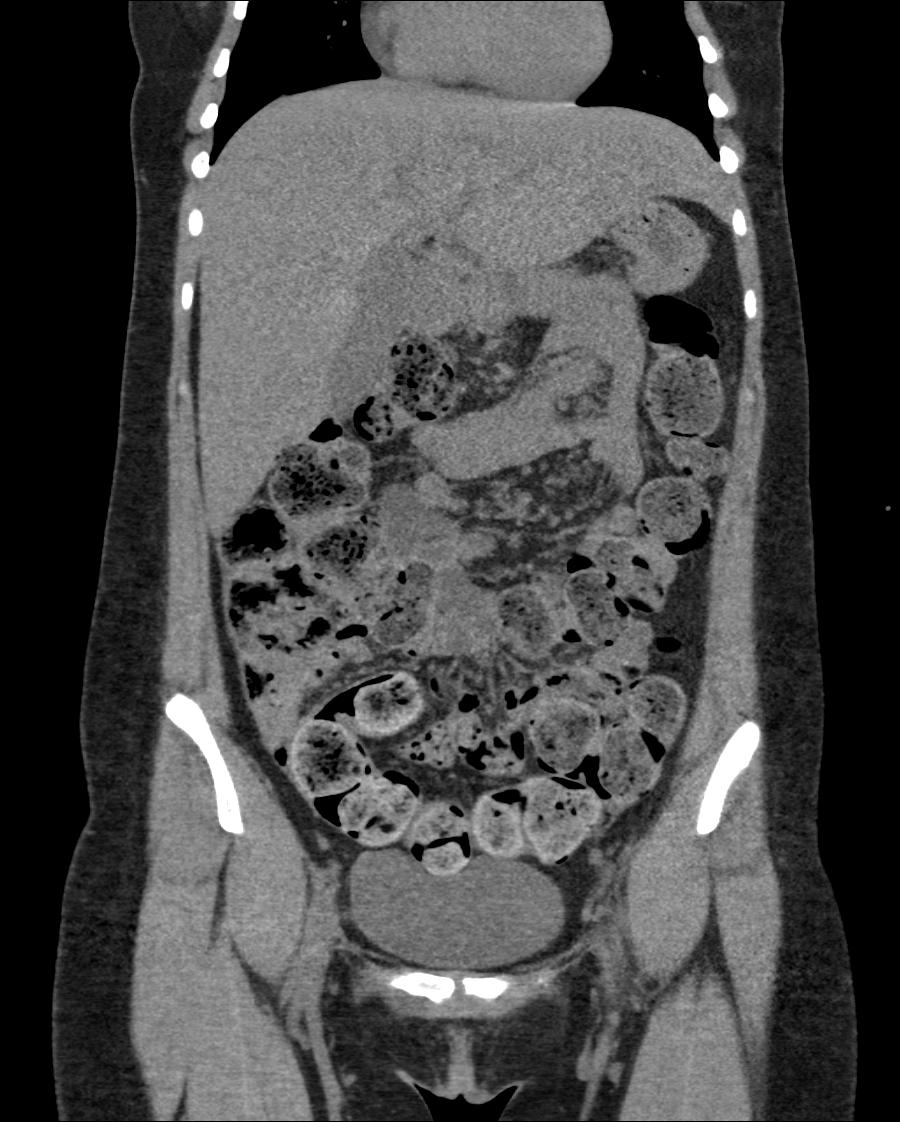
[im 50/112  soft-tissue]
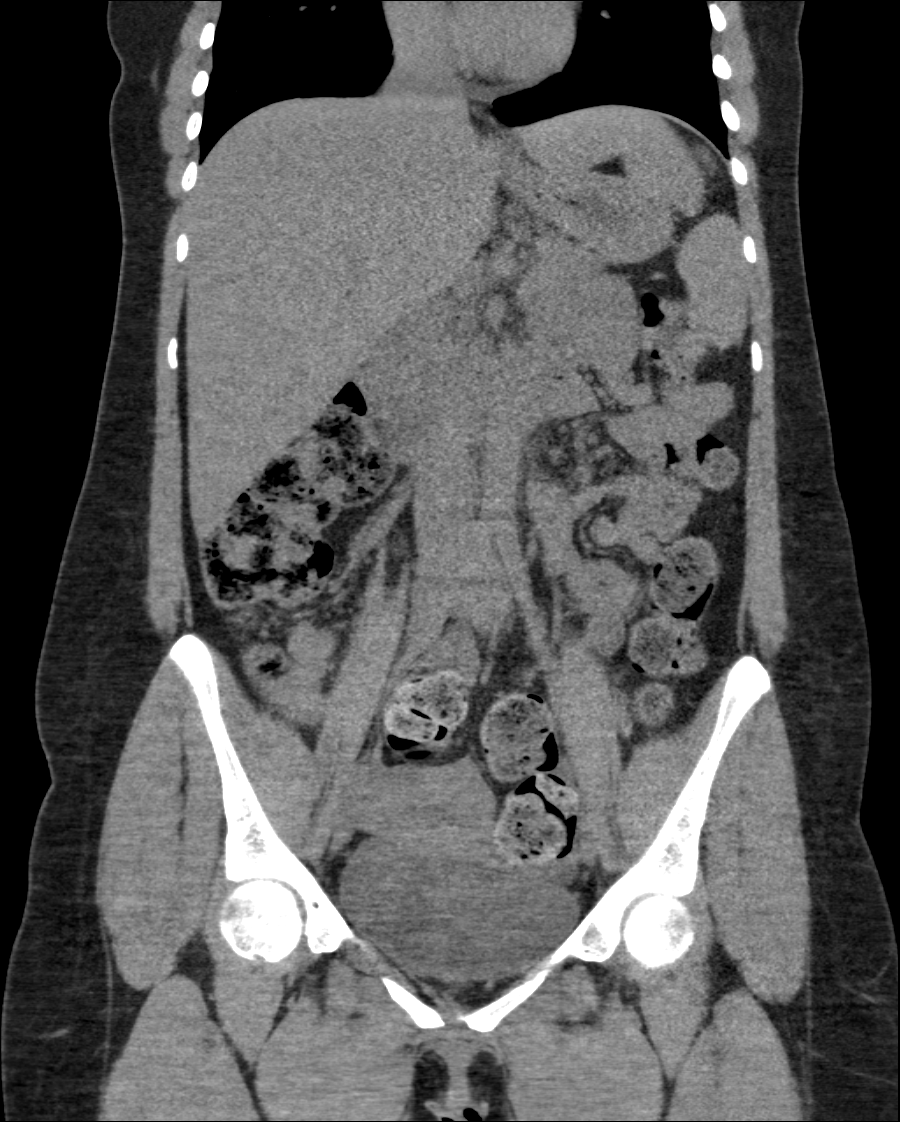
[im 62/112  soft-tissue]
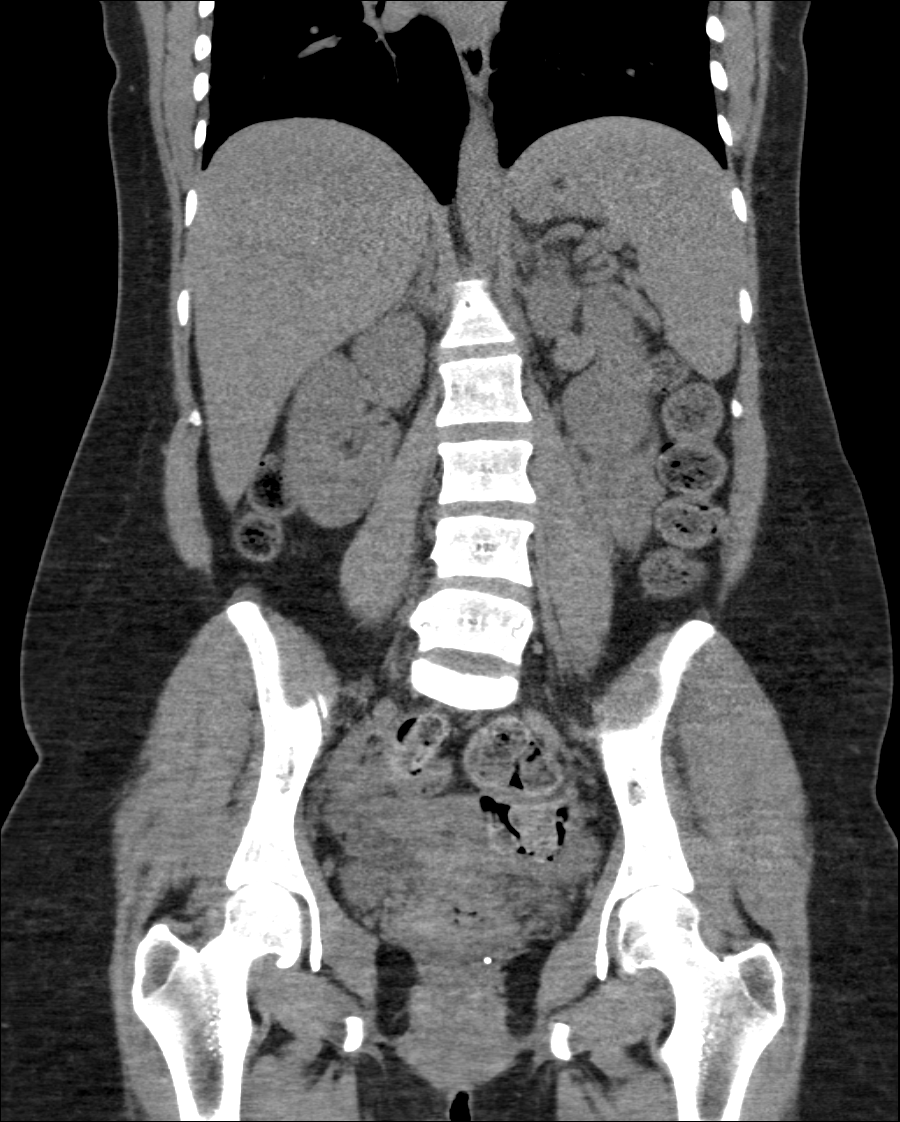

[10 of 46 positions shown; findings below may reference images not displayed]

FINDINGS: Atelectasis in the lung bases.

Kidneys appear symmetrical in size and shape. No hydronephrosis or
hydroureter. No renal, ureteral, or bladder stones. Bladder wall is
not thickened.

The unenhanced appearance of the liver, spleen, gallbladder,
pancreas, adrenal glands, abdominal aorta, inferior vena cava, and
retroperitoneal lymph nodes is unremarkable. Stomach and small bowel
are decompressed. Diffusely stool-filled colon without abnormal
distention. No free air or free fluid in the abdomen.

Pelvis: Uterus and ovaries are not enlarged. Appendix is normal. No
free or loculated pelvic fluid collections. No pelvic mass or
lymphadenopathy. Stool-filled rectosigmoid colon without
inflammatory change. No destructive bone lesions.
IMPRESSION: No renal or ureteral stone or obstruction.

## 2017-10-14 ENCOUNTER — Encounter (HOSPITAL_COMMUNITY): Payer: Self-pay | Admitting: Family Medicine

## 2017-10-14 ENCOUNTER — Ambulatory Visit (HOSPITAL_COMMUNITY)
Admission: EM | Admit: 2017-10-14 | Discharge: 2017-10-14 | Disposition: A | Discharge status: Home / Self Care | Payer: Medicaid Other | Attending: Family Medicine | Admitting: Family Medicine

## 2017-10-14 DIAGNOSIS — R21 Rash and other nonspecific skin eruption: Secondary | ICD-10-CM

## 2017-10-14 MED ORDER — TRIAMCINOLONE ACETONIDE 0.5 % EX OINT: 1.0000 "application " | TOPICAL_OINTMENT | Freq: Two times a day (BID) | CUTANEOUS | 0 refills | Status: DC

## 2017-10-14 NOTE — ED Triage Notes (Signed)
Pt here for rash all over that is itchy. . This has been 1 week. Denies any new soaps, lotions, detergents, food etc. No contacts have the rash. No fever.

## 2017-10-14 NOTE — ED Notes (Signed)
Patient needed to use the restroom, asked patient to provide a clean and dirty urine specimens. Specimens in lab

## 2017-10-14 NOTE — ED Provider Notes (Signed)
[facility]    CSN: [id number] Arrival date & time: 10/14/17  4098     History   Chief Complaint Chief Complaint  Patient presents with  . Rash    HPI Karen Salas is a 32 y.o. female.   32 yo female here for rash x 3 days that is on her back and abdomen and breast area. She also has some on her inner thighs and bilateral arms. The rash is itchy. No known allergic contacts.      Past Medical History:  Diagnosis Date  . Anxiety   . Depression   . Hepatitis    has the antibody but not disease  . Hx of chlamydia infection   . Hx of gonorrhea   . Mental disorder     There are no active problems to display for this patient.   Past Surgical History:  Procedure Laterality Date  . WISDOM TOOTH EXTRACTION      OB History    Gravida  3   Para  3   Term  3   Preterm      AB      Living  2     SAB      TAB      Ectopic      Multiple      Live Births  2            Home Medications    Prior to Admission medications   Medication Sig Start Date End Date Taking? Authorizing Provider  methadone (DOLOPHINE) 10 MG tablet Take 40 mg by mouth daily.   Yes [provider]  QUEtiapine (SEROQUEL) 300 MG tablet Take 600 mg by mouth at bedtime.    [provider]    Family History History reviewed. No pertinent family history.  Social History Social History   Tobacco Use  . Smoking status: Current Every Day Smoker    Packs/day: 1.00    Years: 10.00    Pack years: 10.00    Types: Cigarettes  . Smokeless tobacco: Never Used  Substance Use Topics  . Alcohol use: No    Comment: denied alcohol use  . Drug use: Yes    Types: Cocaine, Marijuana    Comment: cocane weekends, THC daily  used heoin 1 week ago     Allergies   Amoxicillin; Cephalexin; and Morphine and related   Review of Systems Review of Systems  Constitutional: Negative for activity change and appetite change.  HENT: Negative for congestion  and ear discharge.   Eyes: Negative for discharge and itching.  Respiratory: Negative for apnea and chest tightness.   Cardiovascular: Negative for chest pain and palpitations.  Gastrointestinal: Negative for abdominal distention and abdominal pain.  Endocrine: Negative for cold intolerance and heat intolerance.  Genitourinary: Negative for difficulty urinating and dyspareunia.  Musculoskeletal: Negative for arthralgias and back pain.  Skin: Positive for rash.  Neurological: Negative for dizziness and headaches.  Hematological: Negative for adenopathy. Does not bruise/bleed easily.     Physical Exam Triage Vital Signs ED Triage Vitals  Enc Vitals Group     BP 10/14/17 1026 119/71     Pulse Rate 10/14/17 1026 84     Resp 10/14/17 1026 18     Temp 10/14/17 1026 98.5 F (36.9 C)     Temp src --      SpO2 10/14/17 1026 99 %     Weight --      Height --  Head Circumference --      Peak Flow --      Pain Score 10/14/17 1023 0     Pain Loc --      Pain Edu? --      Excl. in GC? --    No data found.  Updated Vital Signs BP 119/71   Pulse 84   Temp 98.5 F (36.9 C)   Resp 18   LMP 10/09/2017   SpO2 99%   Visual Acuity Right Eye Distance:   Left Eye Distance:   Bilateral Distance:    Right Eye Near:   Left Eye Near:    Bilateral Near:     Physical Exam  Constitutional: She is oriented to person, place, and time. She appears well-developed and well-nourished.  HENT:  Head: Normocephalic and atraumatic.  Eyes: Pupils are equal, round, and reactive to light. EOM are normal.  Neck: Normal range of motion.  Pulmonary/Chest: Effort normal. No respiratory distress.  Musculoskeletal: Normal range of motion. She exhibits no edema.  Neurological: She is alert and oriented to person, place, and time.  Skin: Skin is warm and dry.  Erythematous circular papules on abdomen and back and around breast area. Minimal erythema of arms.  Psychiatric: She has a normal mood and  affect. Her behavior is normal.     UC Treatments / Results  Labs (all labs ordered are listed, but only abnormal results are displayed) Labs Reviewed - No data to display  EKG None Radiology No results found.  Procedures Procedures (including critical care time)  Medications Ordered in UC Medications - No data to display   Initial Impression / Assessment and Plan / UC Course  I have reviewed the triage vital signs and the nursing notes.  Pertinent labs & imaging results that were available during my care of the patient were reviewed by me and considered in my medical decision making (see chart for details).     1. Rash- unknown etiology at this time. Ddx: drug rash, eczema, pityriasis rosea, yeast infection. Will trial topical steroid cream.  Final Clinical Impressions(s) / UC Diagnoses   Final diagnoses:  None    ED Discharge Orders    None       Controlled Substance Prescriptions [place] Controlled Substance Registry consulted? Not Applicable   Rolm BookbinderMoss, Senay Sistrunk, DO 10/14/17 1107

## 2019-03-07 ENCOUNTER — Ambulatory Visit
Admission: EM | Admit: 2019-03-07 | Discharge: 2019-03-07 | Disposition: A | Discharge status: Home / Self Care | Payer: Medicaid Other | Attending: Emergency Medicine | Admitting: Emergency Medicine

## 2019-03-07 ENCOUNTER — Other Ambulatory Visit: Payer: Self-pay

## 2019-03-07 DIAGNOSIS — L02214 Cutaneous abscess of groin: Secondary | ICD-10-CM | POA: Diagnosis present

## 2019-03-07 DIAGNOSIS — L0231 Cutaneous abscess of buttock: Secondary | ICD-10-CM | POA: Insufficient documentation

## 2019-03-07 DIAGNOSIS — Z202 Contact with and (suspected) exposure to infections with a predominantly sexual mode of transmission: Secondary | ICD-10-CM

## 2019-03-07 DIAGNOSIS — N898 Other specified noninflammatory disorders of vagina: Secondary | ICD-10-CM | POA: Diagnosis present

## 2019-03-07 DIAGNOSIS — Z711 Person with feared health complaint in whom no diagnosis is made: Secondary | ICD-10-CM | POA: Insufficient documentation

## 2019-03-07 DIAGNOSIS — Z3202 Encounter for pregnancy test, result negative: Secondary | ICD-10-CM

## 2019-03-07 DIAGNOSIS — Z113 Encounter for screening for infections with a predominantly sexual mode of transmission: Secondary | ICD-10-CM

## 2019-03-07 LAB — POCT URINALYSIS DIP (MANUAL ENTRY)
Bilirubin, UA: NEGATIVE
Blood, UA: NEGATIVE
Glucose, UA: NEGATIVE mg/dL
Ketones, POC UA: NEGATIVE mg/dL
Leukocytes, UA: NEGATIVE
Nitrite, UA: NEGATIVE
Protein Ur, POC: NEGATIVE mg/dL
Spec Grav, UA: 1.025 (ref 1.010–1.025)
Urobilinogen, UA: 0.2 E.U./dL
pH, UA: 5.5 (ref 5.0–8.0)

## 2019-03-07 LAB — POCT URINE PREGNANCY: Preg Test, Ur: NEGATIVE

## 2019-03-07 MED ORDER — KETOROLAC TROMETHAMINE 60 MG/2ML IM SOLN
60.0000 mg | Freq: Once | INTRAMUSCULAR | Status: AC
  Administered 2019-03-07: 60 mg via INTRAMUSCULAR

## 2019-03-07 MED ORDER — DOXYCYCLINE HYCLATE 100 MG PO CAPS: 100.0000 mg | ORAL_CAPSULE | Freq: Two times a day (BID) | ORAL | 0 refills | Status: DC

## 2019-03-07 MED ORDER — CEFTRIAXONE SODIUM 250 MG IJ SOLR
250.0000 mg | Freq: Once | INTRAMUSCULAR | Status: AC
  Administered 2019-03-07: 250 mg via INTRAMUSCULAR

## 2019-03-07 MED ORDER — AZITHROMYCIN 500 MG PO TABS
1000.0000 mg | ORAL_TABLET | Freq: Once | ORAL | Status: AC
  Administered 2019-03-07: 11:00:00 1000 mg via ORAL

## 2019-03-07 NOTE — ED Provider Notes (Signed)
[facility]   [id number] 03/07/19 Arrival Time: 1004   WU:[id number]:ABSCESS; STD testing  SUBJECTIVE:  Karen Salas is a 33 y.o. female who presents with possible abscess of her right groin that occurred 3 days. Denies a precipitating event or trauma.  Has tried warm compresses and tylenol without relief.  Reports previous symptoms in the past that improved with incision and drainage.  Complains of associated swelling, redness, and warmth.  Denies fever, chills, nausea, vomiting, abdominal pain.    Also mentions possible abscess/ spider bite to left buttock that she noticed a few days ago.  States it started draining on its own, and has improved.    Patient also complaints of vaginal discharge that began 4 months ago.  Reports sexual activity prior to symptoms. Patient has been sexually active with 2 female partners over the past 6 months.  Partners without symptoms.  Describes discharge as white with a foul odor.  She has NOT tried OTC medications.  Denies worsening symptoms.  She reports similar symptoms in the past and was diagnosed with gonorrhea and chlamydia.  Treated with shot and medication with improvement.  She denies fever, chills, nausea, vomiting, abdominal or pelvic pain, urinary symptoms, vaginal itching, vaginal bleeding, dyspareunia, vaginal rashes or lesions.   ROS: As per HPI.  All other pertinent ROS negative.     Past Medical History:  Diagnosis Date  . Anxiety   . Depression   . Hepatitis    has the antibody but not disease  . Hx of chlamydia infection   . Hx of gonorrhea   . Mental disorder    Past Surgical History:  Procedure Laterality Date  . WISDOM TOOTH EXTRACTION     Allergies  Allergen Reactions  . Amoxicillin Itching  . Cephalexin Hives  . Morphine And Related Rash   No current facility-administered medications on file prior to encounter.    Current Outpatient Medications on File Prior to Encounter  Medication Sig Dispense Refill  .  methadone (DOLOPHINE) 10 MG tablet Take 100 mg by mouth daily.      Social History   Socioeconomic History  . Marital status: Single    Spouse name: Not on file  . Number of children: Not on file  . Years of education: Not on file  . Highest education level: Not on file  Occupational History  . Not on file  Social Needs  . Financial resource strain: Not on file  . Food insecurity    Worry: Not on file    Inability: Not on file  . Transportation needs    Medical: Not on file    Non-medical: Not on file  Tobacco Use  . Smoking status: Current Every Day Smoker    Packs/day: 1.00    Years: 10.00    Pack years: 10.00    Types: Cigarettes  . Smokeless tobacco: Never Used  Substance and Sexual Activity  . Alcohol use: No    Comment: denied alcohol use  . Drug use: Yes    Types: Cocaine, Marijuana    Comment: cocane weekends, THC daily  used heoin 1 week ago  . Sexual activity: Yes    Birth control/protection: None    Comment: crack/cocaine daily  Lifestyle  . Physical activity    Days per week: Not on file    Minutes per session: Not on file  . Stress: Not on file  Relationships  . Social connections    Talks on phone: Not on file  Gets together: Not on file    Attends religious service: Not on file    Active member of club or organization: Not on file    Attends meetings of clubs or organizations: Not on file    Relationship status: Not on file  . Intimate partner violence    Fear of current or ex partner: Not on file    Emotionally abused: Not on file    Physically abused: Not on file    Forced sexual activity: Not on file  Other Topics Concern  . Not on file  Social History Narrative  . Not on file   History reviewed. No pertinent family history.  OBJECTIVE:  Vitals:   03/07/19 1019  BP: 119/82  Pulse: 81  Resp: 18  Temp: 99.1 F (37.3 C)  SpO2: 96%     General appearance: alert; appears uncomfortable, but nontoxic HEENT: NCAT; PERRL, EOMI  grossly; nares patent without rhinorrhea; oropharynx clear CV: RRR Lungs: CTAB; normal respiratory effort Abdomen: soft, nondistended, normal active bowel sounds; nontender to palpation; no guarding  Skin: 3 x 4 cm abscess of her RT groin, erythematous; tender to touch; no active drainage; 3 x 2 cm area of induration/ erythema of left lower buttock, no active drainage, NTTP Psychological: alert and cooperative; normal mood and affect  Procedure: Toradol shot given prior to procedure for patient comfort.  Verbal consent obtained. Area over RT groin abscess cleaned with betadine. Lidocaine 2% with epinephrine used to obtain local anesthesia. The most fluctuant portion of the abscess was incised with a #11 blade scalpel. Abscess cavity explored and evacuated. Loculations broken up with a curved hemostat as best as possible given patient discomfort. Cavity packed with packing material and dressed with a clean gauze dressing. Minimal bleeding. No complications. Patient tearful and screaming during procedure.    LABS:  Results for orders placed or performed during the hospital encounter of 03/07/19 (from the past 24 hour(s))  POCT urinalysis dipstick     Status: None   Collection Time: 03/07/19 10:32 AM  Result Value Ref Range   Color, UA yellow yellow   Clarity, UA clear clear   Glucose, UA negative negative mg/dL   Bilirubin, UA negative negative   Ketones, POC UA negative negative mg/dL   Spec Grav, UA 1.025 1.010 - 1.025   Blood, UA negative negative   pH, UA 5.5 5.0 - 8.0   Protein Ur, POC negative negative mg/dL   Urobilinogen, UA 0.2 0.2 or 1.0 E.U./dL   Nitrite, UA Negative Negative   Leukocytes, UA Negative Negative  POCT urine pregnancy     Status: None   Collection Time: 03/07/19 10:32 AM  Result Value Ref Range   Preg Test, Ur Negative Negative     ASSESSMENT & PLAN:  1. Abscess of right groin   2. Vaginal discharge   3. Vaginal odor   4. Concern about STD in female  without diagnosis   5. Left buttock abscess     Meds ordered this encounter  Medications  . ketorolac (TORADOL) injection 60 mg  . cefTRIAXone (ROCEPHIN) injection 250 mg    Order Specific Question:   Antibiotic Indication:    Answer:   STD  . azithromycin (ZITHROMAX) tablet 1,000 mg  . doxycycline (VIBRAMYCIN) 100 MG capsule    Sig: Take 1 capsule (100 mg total) by mouth 2 (two) times daily.    Dispense:  20 capsule    Refill:  0    Order Specific Question:  Supervising Provider    Answer:   Eustace Moore [[id number]]     Abscess with incision and drainage:  Keep dry and covered for next 24-48 hours Remove packing in 48 hours either at home or return here After packing is removed in you may then begin appling warm compresses 3-4x daily for 10-15 minutes.  You may then wash site daily with warm water and mild soap Keep covered to avoid friction Take antibiotic as prescribed and to completion Return sooner or go to the ED if you have any new or worsening symptoms such as increased redness, swelling, pain, nausea, vomiting, fever, chills, etc...   Concern for STD/ vaginal discharge:  Given rocephin 250mg  injection and azithromycin 1g in office vaginal self swab btained Declines HIV/ syphilis testing today We will follow up with you regarding the results of your test If tests are positive, please abstain from sexual activity for at least 7 days and notify partners Follow up with PCP if symptoms persists Return here or go to ER if you have any new or worsening symptoms fever, chills, nausea, vomiting, abdominal pain, vaginal discharge, vaginal pain, vaginal odor, itching, worsening symptoms despite medications, etc...  Reviewed expectations re: course of current medical issues. Questions answered. Outlined signs and symptoms indicating need for more acute intervention. Patient verbalized understanding. After Visit Summary given.          Rennis Harding, PA-C 03/07/19  1136

## 2019-03-07 NOTE — Discharge Instructions (Addendum)
Urine did not show signs of infection Urine pregnancy negative  Abscess with incision and drainage:  Keep dry and covered for next 24-48 hours Remove packing in 48 hours either at home or return here After packing is removed in you may then begin appling warm compresses 3-4x daily for 10-15 minutes.  You may then wash site daily with warm water and mild soap Keep covered to avoid friction Take antibiotic as prescribed and to completion Return sooner or go to the ED if you have any new or worsening symptoms such as increased redness, swelling, pain, nausea, vomiting, fever, chills, etc...   Concern for STD/ vaginal discharge:  Given rocephin 250mg  injection and azithromycin 1g in office vaginal self swab btained Declines HIV/ syphilis testing today We will follow up with you regarding the results of your test If tests are positive, please abstain from sexual activity for at least 7 days and notify partners Follow up with PCP if symptoms persists Return here or go to ER if you have any new or worsening symptoms fever, chills, nausea, vomiting, abdominal pain, vaginal discharge, vaginal pain, vaginal odor, itching, worsening symptoms despite medications, etc..[name]

## 2019-03-07 NOTE — ED Triage Notes (Signed)
Pt has large abscess in right groin area also states she thinks she has std

## 2019-03-09 ENCOUNTER — Telehealth: Payer: Self-pay | Admitting: Emergency Medicine

## 2019-03-09 LAB — CERVICOVAGINAL ANCILLARY ONLY
Bacterial vaginitis: POSITIVE — AB
Candida vaginitis: POSITIVE — AB
Chlamydia: NEGATIVE
Neisseria Gonorrhea: NEGATIVE
Trichomonas: NEGATIVE

## 2019-03-09 MED ORDER — TINIDAZOLE 500 MG PO TABS: 1.0000 g | ORAL_TABLET | Freq: Every day | ORAL | 0 refills | Status: AC

## 2019-03-09 MED ORDER — CLOTRIMAZOLE 2 % VA CREA: 1.0000 | TOPICAL_CREAM | Freq: Every day | VAGINAL | Status: AC

## 2019-03-09 NOTE — Telephone Encounter (Signed)
Cervicovaginal swab positive for BV and yeast.  Medications sent into pharmacy on file.  Amy Stone RN will call patient with results.

## 2020-01-01 ENCOUNTER — Encounter: Payer: Self-pay | Admitting: Emergency Medicine

## 2020-01-01 ENCOUNTER — Other Ambulatory Visit: Payer: Self-pay

## 2020-01-01 ENCOUNTER — Ambulatory Visit
Admission: EM | Admit: 2020-01-01 | Discharge: 2020-01-01 | Disposition: A | Discharge status: Home / Self Care | Payer: Medicaid Other | Attending: Emergency Medicine | Admitting: Emergency Medicine

## 2020-01-01 DIAGNOSIS — M25511 Pain in right shoulder: Secondary | ICD-10-CM

## 2020-01-01 MED ORDER — PREDNISONE 10 MG PO TABS: 20.0000 mg | ORAL_TABLET | Freq: Every day | ORAL | 0 refills | Status: DC

## 2020-01-01 MED ORDER — ACETAMINOPHEN 500 MG PO TABS: 500.0000 mg | ORAL_TABLET | Freq: Four times a day (QID) | ORAL | 0 refills | Status: AC | PRN

## 2020-01-01 NOTE — ED Provider Notes (Signed)
[facility]   [id number] 01/01/20 Arrival Time: 0844   Chief Complaint  Patient presents with  . Shoulder Pain     SUBJECTIVE: History from: patient.  Karen Salas is a 34 y.o. female who presented to the urgent care with a complaint of right shoulder pain for the past few days.  Reports symptoms are warm where she does not lifting and pushing..  She localized the pain to her right shoulder.  She describes the pain as constant and achy.  She has tried OTC medications without relief.  Her symptoms are made worse with ROM.  She denies similar symptoms in the past.  Denies chills, fever, nausea, vomiting, diarrhea.  ROS: As per HPI.  All other pertinent ROS negative.      Past Medical History:  Diagnosis Date  . Anxiety   . Depression   . Hepatitis    has the antibody but not disease  . Hx of chlamydia infection   . Hx of gonorrhea   . Mental disorder    Past Surgical History:  Procedure Laterality Date  . WISDOM TOOTH EXTRACTION     Allergies  Allergen Reactions  . Amoxicillin Itching  . Cephalexin Hives  . Morphine And Related Rash   No current facility-administered medications on file prior to encounter.   Current Outpatient Medications on File Prior to Encounter  Medication Sig Dispense Refill  . doxycycline (VIBRAMYCIN) 100 MG capsule Take 1 capsule (100 mg total) by mouth 2 (two) times daily. 20 capsule 0  . methadone (DOLOPHINE) 10 MG tablet Take 100 mg by mouth daily.      Social History   Socioeconomic History  . Marital status: Single    Spouse name: Not on file  . Number of children: Not on file  . Years of education: Not on file  . Highest education level: Not on file  Occupational History  . Not on file  Tobacco Use  . Smoking status: Current Every Day Smoker    Packs/day: 1.00    Years: 10.00    Pack years: 10.00    Types: Cigarettes  . Smokeless tobacco: Never Used  Substance and Sexual Activity  . Alcohol use: No     Comment: denied alcohol use  . Drug use: Not Currently    Types: Cocaine, Marijuana    Comment: cocane weekends, THC daily  used heoin 1 week ago  . Sexual activity: Yes    Birth control/protection: None    Comment: crack/cocaine daily  Other Topics Concern  . Not on file  Social History Narrative  . Not on file   Social Determinants of Health   Financial Resource Strain:   . Difficulty of Paying Living Expenses:   Food Insecurity:   . Worried About Programme researcher, broadcasting/film/video in the Last Year:   . Barista in the Last Year:   Transportation Needs:   . Freight forwarder (Medical):   [name] Lack of Transportation (Non-Medical):   Physical Activity:   . Days of Exercise per Week:   . Minutes of Exercise per Session:   Stress:   . Feeling of Stress :   Social Connections:   . Frequency of Communication with Friends and Family:   . Frequency of Social Gatherings with Friends and Family:   . Attends Religious Services:   . Active Member of Clubs or Organizations:   . Attends Banker Meetings:   [name] Marital Status:   Intimate Programme researcher, broadcasting/film/video  Violence:   . Fear of Current or Ex-Partner:   . Emotionally Abused:   [name] Physically Abused:   . Sexually Abused:    No family history on file.  OBJECTIVE:  Vitals:   01/01/20 0852  BP: 114/72  Pulse: 72  Resp: 16  Temp: 98.4 F (36.9 C)  TempSrc: Oral  SpO2: 98%     Physical Exam Vitals and nursing note reviewed.  Constitutional:      General: She is not in acute distress.    Appearance: Normal appearance. She is normal weight. She is not ill-appearing, toxic-appearing or diaphoretic.  Cardiovascular:     Rate and Rhythm: Normal rate and regular rhythm.     Pulses: Normal pulses.     Heart sounds: Normal heart sounds. No murmur heard.  No friction rub. No gallop.   Pulmonary:     Effort: Pulmonary effort is normal. No respiratory distress.     Breath sounds: Normal breath sounds. No stridor. No wheezing, rhonchi or  rales.  Chest:     Chest wall: No tenderness.  Musculoskeletal:        General: Tenderness present.     Comments: Right shoulder is without obvious asymmetry or deformity when compared to the left shoulder.  There is no surface trauma, ecchymosis, crepitus, open wound.  Limited range of motion due to pain.  Neurovascular status intact  Neurological:     Mental Status: She is alert.     LABS:  No results found for this or any previous visit (from the past 24 hour(s)).   ASSESSMENT & PLAN:  1. Acute pain of right shoulder     Meds ordered this encounter  Medications  . predniSONE (DELTASONE) 10 MG tablet    Sig: Take 2 tablets (20 mg total) by mouth daily.    Dispense:  15 tablet    Refill:  0  . acetaminophen (TYLENOL) 500 MG tablet    Sig: Take 1 tablet (500 mg total) by mouth every 6 (six) hours as needed.    Dispense:  30 tablet    Refill:  0   Patient is stable at discharge.  Her symptoms likely from a tendinitis.  Will prescribe prednisone and Tylenol as needed for pain.  Was advised to follow-up with PCP.  Discharge instructions Prednisone and Tylenol were prescribed/take as directed Follow RICE instruction that is attached Follow-up with PCP Return or go to ED for worsening of symptoms  Reviewed expectations re: course of current medical issues. Questions answered. Outlined signs and symptoms indicating need for more acute intervention. Patient verbalized understanding. After Visit Summary given.      Note: This document was prepared using Dragon voice recognition software and may include unintentional dictation errors.    Karen Parcel, FNP 01/01/20 [phone]

## 2020-01-01 NOTE — ED Triage Notes (Signed)
Pain to RT shoulder with movement.  Denies any injury. Pt does repetitive movements at work.

## 2020-01-01 NOTE — Discharge Instructions (Addendum)
Prednisone and Tylenol were prescribed/take as directed Follow RICE instruction that is attached Follow-up with PCP Return or go to ED for worsening of symptoms

## 2020-02-08 ENCOUNTER — Ambulatory Visit
Admission: EM | Admit: 2020-02-08 | Discharge: 2020-02-08 | Disposition: A | Discharge status: Home / Self Care | Payer: Medicaid Other | Attending: Emergency Medicine | Admitting: Emergency Medicine

## 2020-02-08 DIAGNOSIS — M7581 Other shoulder lesions, right shoulder: Secondary | ICD-10-CM

## 2020-02-08 DIAGNOSIS — M25511 Pain in right shoulder: Secondary | ICD-10-CM

## 2020-02-08 MED ORDER — CYCLOBENZAPRINE HCL 10 MG PO TABS
10.0000 mg | ORAL_TABLET | Freq: Every day | ORAL | 0 refills | Status: DC
Start: 2020-02-08 — End: 2020-04-24

## 2020-02-08 MED ORDER — PREDNISONE 10 MG (21) PO TBPK: ORAL_TABLET | Freq: Every day | ORAL | 0 refills | Status: DC

## 2020-02-08 NOTE — Discharge Instructions (Signed)
Continue conservative management of rest, ice, and gentle stretches Prednisone prescribed.  Take as directed and to completion Take cyclobenzaprine at nighttime for symptomatic relief. Avoid driving or operating heavy machinery while using medication. Follow up with orthopedist for further evaluation and management Return or go to the ER if you have any new or worsening symptoms (fever, chills, chest pain, shortness of breath, worsening symptoms despite treatment, etc...)

## 2020-02-08 NOTE — ED Triage Notes (Signed)
Pt returns with c/o right shoulder pain

## 2020-02-08 NOTE — ED Provider Notes (Signed)
[facility]   [id number] 02/08/20 Arrival Time: 1442  CC: RT shoulder PAIN  SUBJECTIVE: History from: patient. Karen Salas is a 34 y.o. female complains of acute on chronic RT shoulder pain x 1 month.  Recent flare up this past week.  Symptoms began after pushing down equipment at work a month ago.  Then exacerbated while catching a hose at work recently.  Localizes the pain to the back of shoulder.  Describes the pain as intermittent and sharp in character.  Has tried prednisone with minimal relief.  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  Denies similar symptoms in the past.  Denies fever, chills, erythema, ecchymosis, effusion, weakness, numbness and tingling.  ROS: As per HPI.  All other pertinent ROS negative.     Past Medical History:  Diagnosis Date  . Anxiety   . Depression   . Hepatitis    has the antibody but not disease  . Hx of chlamydia infection   . Hx of gonorrhea   . Mental disorder    Past Surgical History:  Procedure Laterality Date  . WISDOM TOOTH EXTRACTION     Allergies  Allergen Reactions  . Amoxicillin Itching  . Cephalexin Hives  . Morphine And Related Rash   No current facility-administered medications on file prior to encounter.   Current Outpatient Medications on File Prior to Encounter  Medication Sig Dispense Refill  . acetaminophen (TYLENOL) 500 MG tablet Take 1 tablet (500 mg total) by mouth every 6 (six) hours as needed. 30 tablet 0  . doxycycline (VIBRAMYCIN) 100 MG capsule Take 1 capsule (100 mg total) by mouth 2 (two) times daily. 20 capsule 0  . methadone (DOLOPHINE) 10 MG tablet Take 100 mg by mouth daily.      Social History   Socioeconomic History  . Marital status: Single    Spouse name: Not on file  . Number of children: Not on file  . Years of education: Not on file  . Highest education level: Not on file  Occupational History  . Not on file  Tobacco Use  . Smoking status: Current Every  Day Smoker    Packs/day: 1.00    Years: 10.00    Pack years: 10.00    Types: Cigarettes  . Smokeless tobacco: Never Used  Substance and Sexual Activity  . Alcohol use: No    Comment: denied alcohol use  . Drug use: Not Currently    Types: Cocaine, Marijuana    Comment: cocane weekends, THC daily  used heoin 1 week ago  . Sexual activity: Yes    Birth control/protection: None    Comment: crack/cocaine daily  Other Topics Concern  . Not on file  Social History Narrative  . Not on file   Social Determinants of Health   Financial Resource Strain:   . Difficulty of Paying Living Expenses:   Food Insecurity:   . Worried About Programme researcher, broadcasting/film/video in the Last Year:   . Barista in the Last Year:   Transportation Needs:   . Freight forwarder (Medical):   [name] Lack of Transportation (Non-Medical):   Physical Activity:   . Days of Exercise per Week:   . Minutes of Exercise per Session:   Stress:   . Feeling of Stress :   Social Connections:   . Frequency of Communication with Friends and Family:   . Frequency of Social Gatherings with Friends and Family:   . Attends  Religious Services:   . Active Member of Clubs or Organizations:   . Attends Banker Meetings:   [name] Marital Status:   Intimate Partner Violence:   . Fear of Current or Ex-Partner:   . Emotionally Abused:   [name] Physically Abused:   . Sexually Abused:    History reviewed. No pertinent family history.  OBJECTIVE:  Vitals:   02/08/20 1500  BP: 133/83  Pulse: 64  Resp: 17  Temp: 98.5 F (36.9 C)  TempSrc: Oral  SpO2: 97%    General appearance: ALERT; in no acute distress.  Head: NCAT Lungs: Normal respiratory effort; CTAB CV: RRR Musculoskeletal: RT shoulder Inspection: Skin warm, dry, clear and intact without obvious erythema, effusion, or ecchymosis.  Palpation: TTP over posterior aspect of shoulder and trapezius as well as lateral deltoid ROM: FROM active and passive Strength:  5/5 shld abduction, 5/5 shld adduction, 5/5 elbow flexion, 5/5 elbow extension, 5/5 grip strength Skin: warm and dry Neurologic: Ambulates without difficulty; Sensation intact about the upper/ lower extremities Psychological: alert and cooperative; normal mood and affect   ASSESSMENT & PLAN:  1. Acute pain of right shoulder   2. Rotator cuff tendinitis, right    Meds ordered this encounter  Medications  . predniSONE (STERAPRED UNI-PAK 21 TAB) 10 MG (21) TBPK tablet    Sig: Take by mouth daily. Take 6 tabs by mouth daily  for 2 days, then 5 tabs for 2 days, then 4 tabs for 2 days, then 3 tabs for 2 days, 2 tabs for 2 days, then 1 tab by mouth daily for 2 days    Dispense:  42 tablet    Refill:  0    Order Specific Question:   Supervising Provider    Answer:   Karen Salas [[id number]]  . cyclobenzaprine (FLEXERIL) 10 MG tablet    Sig: Take 1 tablet (10 mg total) by mouth at bedtime.    Dispense:  15 tablet    Refill:  0    Order Specific Question:   Supervising Provider    Answer:   Karen Salas [[id number]]   Continue conservative management of rest, ice, and gentle stretches Prednisone prescribed.  Take as directed and to completion Take cyclobenzaprine at nighttime for symptomatic relief. Avoid driving or operating heavy machinery while using medication. Follow up with orthopedist for further evaluation and management Return or go to the ER if you have any new or worsening symptoms (fever, chills, chest pain, shortness of breath, worsening symptoms despite treatment, etc...)   Reviewed expectations re: course of current medical issues. Questions answered. Outlined signs and symptoms indicating need for more acute intervention. Patient verbalized understanding. After Visit Summary given.    Karen Harding, PA-C 02/08/20 1541

## 2020-04-24 ENCOUNTER — Encounter: Payer: Self-pay | Admitting: Emergency Medicine

## 2020-04-24 ENCOUNTER — Ambulatory Visit
Admission: EM | Admit: 2020-04-24 | Discharge: 2020-04-24 | Disposition: A | Discharge status: Home / Self Care | Payer: Medicaid Other | Attending: Emergency Medicine | Admitting: Emergency Medicine

## 2020-04-24 DIAGNOSIS — Z23 Encounter for immunization: Secondary | ICD-10-CM

## 2020-04-24 DIAGNOSIS — N898 Other specified noninflammatory disorders of vagina: Secondary | ICD-10-CM | POA: Diagnosis not present

## 2020-04-24 DIAGNOSIS — X12XXXA Contact with other hot fluids, initial encounter: Secondary | ICD-10-CM | POA: Diagnosis not present

## 2020-04-24 DIAGNOSIS — R300 Dysuria: Secondary | ICD-10-CM | POA: Insufficient documentation

## 2020-04-24 DIAGNOSIS — T23262A Burn of second degree of back of left hand, initial encounter: Secondary | ICD-10-CM | POA: Diagnosis not present

## 2020-04-24 LAB — POCT URINALYSIS DIP (MANUAL ENTRY)
Bilirubin, UA: NEGATIVE
Blood, UA: NEGATIVE
Glucose, UA: NEGATIVE mg/dL
Ketones, POC UA: NEGATIVE mg/dL
Leukocytes, UA: NEGATIVE
Nitrite, UA: NEGATIVE
Protein Ur, POC: NEGATIVE mg/dL
Spec Grav, UA: 1.025 (ref 1.010–1.025)
Urobilinogen, UA: 0.2 E.U./dL
pH, UA: 6 (ref 5.0–8.0)

## 2020-04-24 LAB — POCT URINE PREGNANCY: Preg Test, Ur: NEGATIVE

## 2020-04-24 MED ORDER — SILVER SULFADIAZINE 1 % EX CREA
1.0000 | TOPICAL_CREAM | Freq: Every day | CUTANEOUS | 0 refills | Status: DC
Start: 2020-04-24 — End: 2021-09-01

## 2020-04-24 MED ORDER — TETANUS-DIPHTH-ACELL PERTUSSIS 5-2.5-18.5 LF-MCG/0.5 IM SUSP
0.5000 mL | Freq: Once | INTRAMUSCULAR | Status: AC
  Administered 2020-04-24: 0.5 mL via INTRAMUSCULAR

## 2020-04-24 MED ORDER — METRONIDAZOLE 500 MG PO TABS
500.0000 mg | ORAL_TABLET | Freq: Two times a day (BID) | ORAL | 0 refills | Status: DC
Start: 2020-04-24 — End: 2021-03-14

## 2020-04-24 NOTE — Discharge Instructions (Signed)
Vaginal discharge: Urine without signs of infection Urine culture sent.   Vaginal swab obtained We will call you with abnormal results We will cover for BV today based on symptoms.  Metronidazole prescribed Push fluids and get plenty of rest.   Follow up with PCP if symptoms persists Return here or go to ER if you have any new or worsening symptoms such as fever, worsening abdominal pain, nausea/vomiting, flank pain, etc...  Burn: Dressing applied Tetanus updated Clean with warm water and mild soap Change dressing daily Silvadene cream prescribed.  Use as directed Apply cool packs as needed (over dressing), alternate ibuprofen and/or tylenol as needed for pain  Follow up with PCP or burn center in the next few days for recheck and to ensure it is improving Return or go to the ER if you have any new or worsening symptoms such as fever, chills, nausea, vomiting, redness, swelling, discharge, if symptoms do not improve with medications, etc..[name]
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
## 2020-04-26 LAB — URINE CULTURE

## 2020-04-28 LAB — CERVICOVAGINAL ANCILLARY ONLY
Bacterial Vaginitis (gardnerella): POSITIVE — AB
Candida Glabrata: NEGATIVE
Candida Vaginitis: NEGATIVE
Chlamydia: NEGATIVE
Comment: NEGATIVE
Comment: NEGATIVE
Comment: NEGATIVE
Comment: NEGATIVE
Comment: NEGATIVE
Comment: NORMAL
Neisseria Gonorrhea: NEGATIVE
Trichomonas: NEGATIVE

## 2020-04-29 ENCOUNTER — Ambulatory Visit
Admission: EM | Admit: 2020-04-29 | Discharge: 2020-04-29 | Disposition: A | Discharge status: Home / Self Care | Payer: Medicaid Other | Attending: Emergency Medicine | Admitting: Emergency Medicine

## 2020-04-29 ENCOUNTER — Encounter: Payer: Self-pay | Admitting: Emergency Medicine

## 2020-04-29 DIAGNOSIS — T23212A Burn of second degree of left thumb (nail), initial encounter: Secondary | ICD-10-CM | POA: Diagnosis not present

## 2020-04-29 DIAGNOSIS — T23222A Burn of second degree of single left finger (nail) except thumb, initial encounter: Secondary | ICD-10-CM | POA: Diagnosis not present

## 2020-04-29 MED ORDER — IBUPROFEN 800 MG PO TABS
800.0000 mg | ORAL_TABLET | Freq: Three times a day (TID) | ORAL | 0 refills | Status: AC
Start: 2020-04-29 — End: ?

## 2020-04-29 MED ORDER — DOXYCYCLINE HYCLATE 100 MG PO CAPS
100.0000 mg | ORAL_CAPSULE | Freq: Two times a day (BID) | ORAL | 0 refills | Status: DC
Start: 2020-04-29 — End: 2021-03-14
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
## 2020-04-29 NOTE — Discharge Instructions (Signed)
Doxycycline twice daily for 1 week for infection  Please use Tylenol [phone] mg every 4-6 hours, may supplement with Ibuprofen 600 mg every 8 hours May apply aloe vera for further relief/healing May apply wet gauze to keep cool Do not apply ice  For any further burns run cool water over burn for 20 min immediately after

## 2020-05-15 ENCOUNTER — Other Ambulatory Visit: Payer: Self-pay

## 2020-05-15 ENCOUNTER — Encounter: Payer: Self-pay | Admitting: Emergency Medicine

## 2020-05-15 ENCOUNTER — Ambulatory Visit
Admission: EM | Admit: 2020-05-15 | Discharge: 2020-05-15 | Disposition: A | Discharge status: Home / Self Care | Payer: Medicaid Other | Attending: Emergency Medicine | Admitting: Emergency Medicine

## 2020-05-15 DIAGNOSIS — J029 Acute pharyngitis, unspecified: Secondary | ICD-10-CM | POA: Diagnosis not present

## 2020-05-15 LAB — POCT RAPID STREP A (OFFICE): Rapid Strep A Screen: NEGATIVE

## 2020-05-15 MED ORDER — LIDOCAINE VISCOUS HCL 2 % MT SOLN
15.0000 mL | OROMUCOSAL | 1 refills | Status: AC | PRN
Start: 2020-05-15 — End: ?

## 2020-05-15 NOTE — Discharge Instructions (Signed)
Strep test negative, will send out for culture and we will call you with results Get plenty of rest and push fluids Lidocaine mouthwash was prescribed/take as directed. Drink warm or cool liquids, use throat lozenges, or popsicles to help alleviate symptoms Take OTC ibuprofen or tylenol as needed for pain Follow up with PCP if symptoms persists Return or go to ER if patient has any new or worsening symptoms such as fever, chills, nausea, vomiting, worsening sore throat, cough, abdominal pain, chest pain, changes in bowel or bladder habits, etc..[name]

## 2020-05-15 NOTE — ED Triage Notes (Signed)
Patient states that she has sore throat and swollen lymph nodes.

## 2020-05-15 NOTE — ED Provider Notes (Signed)
[facility]   [id number] 05/15/20 Arrival Time: 1247  CV:ELFY THROAT  SUBJECTIVE: History from: patient and family.  TAUHEEDAH BOK is a 34 y.o. female who presents with abrupt onset of sore throat f for the past 1 day.  Denies sick exposure to strep, flu or mono, or precipitating event.  Has tried OTC t medication without relief.  Symptoms are made worse with swallowing, but tolerating liquids and own secretions without difficulty.  Denies previous symptoms in the past.  Denies fever, chills, fatigue, ear pain, sinus pain, rhinorrhea, nasal congestion, cough, SOB, wheezing, chest pain, nausea, rash, changes in bowel or bladder habits.    ROS: As per HPI.  All other pertinent ROS negative.      Past Medical History:  Diagnosis Date  . Anxiety   . Depression   . Hepatitis    has the antibody but not disease  . Hx of chlamydia infection   . Hx of gonorrhea   . Mental disorder    Past Surgical History:  Procedure Laterality Date  . WISDOM TOOTH EXTRACTION     Allergies  Allergen Reactions  . Amoxicillin Itching  . Cephalexin Hives  . Morphine And Related Rash   No current facility-administered medications on file prior to encounter.   Current Outpatient Medications on File Prior to Encounter  Medication Sig Dispense Refill  . acetaminophen (TYLENOL) 500 MG tablet Take 1 tablet (500 mg total) by mouth every 6 (six) hours as needed. 30 tablet 0  . doxycycline (VIBRAMYCIN) 100 MG capsule Take 1 capsule (100 mg total) by mouth 2 (two) times daily. 20 capsule 0  . ibuprofen (ADVIL) 800 MG tablet Take 1 tablet (800 mg total) by mouth 3 (three) times daily. 21 tablet 0  . methadone (DOLOPHINE) 10 MG tablet Take 100 mg by mouth daily.     . metroNIDAZOLE (FLAGYL) 500 MG tablet Take 1 tablet (500 mg total) by mouth 2 (two) times daily. 14 tablet 0  . silver sulfADIAZINE (SILVADENE) 1 % cream Apply 1 application topically daily. 50 g 0   Social History    Socioeconomic History  . Marital status: Significant Other    Spouse name: Not on file  . Number of children: Not on file  . Years of education: Not on file  . Highest education level: Not on file  Occupational History  . Not on file  Tobacco Use  . Smoking status: Current Every Day Smoker    Packs/day: 1.00    Years: 10.00    Pack years: 10.00    Types: Cigarettes  . Smokeless tobacco: Never Used  Substance and Sexual Activity  . Alcohol use: No    Comment: denied alcohol use  . Drug use: Not Currently    Types: Cocaine, Marijuana    Comment: cocane weekends, THC daily  used heoin 1 week ago  . Sexual activity: Yes    Birth control/protection: None    Comment: crack/cocaine daily  Other Topics Concern  . Not on file  Social History Narrative  . Not on file   Social Determinants of Health   Financial Resource Strain:   . Difficulty of Paying Living Expenses: Not on file  Food Insecurity:   . Worried About Programme researcher, broadcasting/film/video in the Last Year: Not on file  . Ran Out of Food in the Last Year: Not on file  Transportation Needs:   . Lack of Transportation (Medical): Not on file  . Lack of Transportation (Non-Medical):  Not on file  Physical Activity:   . Days of Exercise per Week: Not on file  . Minutes of Exercise per Session: Not on file  Stress:   . Feeling of Stress : Not on file  Social Connections:   . Frequency of Communication with Friends and Family: Not on file  . Frequency of Social Gatherings with Friends and Family: Not on file  . Attends Religious Services: Not on file  . Active Member of Clubs or Organizations: Not on file  . Attends Banker Meetings: Not on file  . Marital Status: Not on file  Intimate Partner Violence:   . Fear of Current or Ex-Partner: Not on file  . Emotionally Abused: Not on file  . Physically Abused: Not on file  . Sexually Abused: Not on file   No family history on file.  OBJECTIVE:  Vitals:   05/15/20  1252  BP: 121/77  Pulse: 65  Resp: 16  Temp: 98.2 F (36.8 C)  SpO2: 98%     General appearance: alert; appears fatigued, but nontoxic, speaking in full sentences and managing own secretions HEENT: NCAT; Ears: EACs clear, TMs pearly gray with visible cone of light, without erythema; Eyes: PERRL, EOMI grossly; Nose: no obvious rhinorrhea; Throat: oropharynx clear, tonsils 1+ and mildly erythematous without white tonsillar exudates, uvula midline Neck: supple without LAD Lungs: CTA bilaterally without adventitious breath sounds; cough absent Heart: regular rate and rhythm.  Radial pulses 2+ symmetrical bilaterally Skin: warm and dry Psychological: alert and cooperative; normal mood and affect  LABS: Results for orders placed or performed during the hospital encounter of 05/15/20 (from the past 24 hour(s))  POCT rapid strep A     Status: None   Collection Time: 05/15/20  1:07 PM  Result Value Ref Range   Rapid Strep A Screen Negative Negative     ASSESSMENT & PLAN:  1. Sore throat     Meds ordered this encounter  Medications  . lidocaine (XYLOCAINE) 2 % solution    Sig: Use as directed 15 mLs in the mouth or throat as needed for mouth pain.    Dispense:  100 mL    Refill:  1   Discharge instructions   Strep test negative, will send out for culture and we will call you with results Get plenty of rest and push fluids Lidocaine mouthwash was prescribed/take as directed. Drink warm or cool liquids, use throat lozenges, or popsicles to help alleviate symptoms Take OTC ibuprofen or tylenol as needed for pain Follow up with PCP if symptoms persists Return or go to ER if patient has any new or worsening symptoms such as fever, chills, nausea, vomiting, worsening sore throat, cough, abdominal pain, chest pain, changes in bowel or bladder habits, etc...  Reviewed expectations re: course of current medical issues. Questions answered. Outlined signs and symptoms indicating need for  more acute intervention. Patient verbalized understanding. After Visit Summary given.        [name], FNP 05/15/20 1322

## 2020-05-18 LAB — CULTURE, GROUP A STREP (THRC)

## 2021-03-14 ENCOUNTER — Encounter: Payer: Self-pay | Admitting: Emergency Medicine

## 2021-03-14 ENCOUNTER — Ambulatory Visit
Admission: EM | Admit: 2021-03-14 | Discharge: 2021-03-14 | Disposition: A | Discharge status: Home / Self Care | Payer: Medicaid Other | Attending: Emergency Medicine | Admitting: Emergency Medicine

## 2021-03-14 DIAGNOSIS — N898 Other specified noninflammatory disorders of vagina: Secondary | ICD-10-CM

## 2021-03-14 MED ORDER — METRONIDAZOLE 500 MG PO TABS
500.0000 mg | ORAL_TABLET | Freq: Two times a day (BID) | ORAL | 0 refills | Status: DC
Start: 2021-03-14 — End: 2021-09-01

## 2021-03-14 NOTE — ED Provider Notes (Signed)
[facility]   [id number] 03/14/21 Arrival Time: 1313   OX:[id number] odor  SUBJECTIVE:  Karen Salas is a 35 y.o. female who presents with complaints of fishy vaginal odor x 1 month.  She denies a precipitating event, recent sexual encounter or recent antibiotic use.  Last sex 1 month ago with fiance.  Denies alleviating factors.  She reports similar symptoms in the past and was diagnosed with BV.  She denies fever, chills, nausea, vomiting, abdominal or pelvic pain, urinary symptoms, vaginal itching, vaginal bleeding, dyspareunia, vaginal rashes or lesions.   Patient's last menstrual period was 03/07/2021.  ROS: As per HPI.  All other pertinent ROS negative.     Past Medical History:  Diagnosis Date   Anxiety    Depression    Hepatitis    has the antibody but not disease   Hx of chlamydia infection    Hx of gonorrhea    Mental disorder    Past Surgical History:  Procedure Laterality Date   WISDOM TOOTH EXTRACTION     Allergies  Allergen Reactions   Amoxicillin Itching   Cephalexin Hives   Morphine And Related Rash   No current facility-administered medications on file prior to encounter.   Current Outpatient Medications on File Prior to Encounter  Medication Sig Dispense Refill   acetaminophen (TYLENOL) 500 MG tablet Take 1 tablet (500 mg total) by mouth every 6 (six) hours as needed. 30 tablet 0   ibuprofen (ADVIL) 800 MG tablet Take 1 tablet (800 mg total) by mouth 3 (three) times daily. 21 tablet 0   lidocaine (XYLOCAINE) 2 % solution Use as directed 15 mLs in the mouth or throat as needed for mouth pain. 100 mL 1   silver sulfADIAZINE (SILVADENE) 1 % cream Apply 1 application topically daily. 50 g 0    Social History   Socioeconomic History   Marital status: Significant Other    Spouse name: Not on file   Number of children: Not on file   Years of education: Not on file   Highest education level: Not on file  Occupational History   Not on file   Tobacco Use   Smoking status: Every Day    Packs/day: 1.00    Years: 10.00    Pack years: 10.00    Types: Cigarettes   Smokeless tobacco: Never  Vaping Use   Vaping Use: Never used  Substance and Sexual Activity   Alcohol use: No    Comment: denied alcohol use   Drug use: Not Currently   Sexual activity: Yes    Birth control/protection: None  Other Topics Concern   Not on file  Social History Narrative   Not on file   Social Determinants of Health   Financial Resource Strain: Not on file  Food Insecurity: Not on file  Transportation Needs: Not on file  Physical Activity: Not on file  Stress: Not on file  Social Connections: Not on file  Intimate Partner Violence: Not on file   History reviewed. No pertinent family history.  OBJECTIVE:  Vitals:   03/14/21 1357 03/14/21 1358  BP: 118/71   Pulse: 76   Resp: 20   Temp: 98.5 F (36.9 C)   TempSrc: Oral   SpO2: 98%   Weight:  140 lb (63.5 kg)  Height:  5\' 5"  (1.651 m)     General appearance: Alert, NAD, appears stated age Head: NCAT Throat: lips, mucosa, and tongue normal; teeth and gums normal Lungs: CTA bilaterally without  adventitious breath sounds Heart: regular rate and rhythm.   Abdomen: soft, non-tender; bowel sounds normal; no guarding GU: deferred Skin: warm and dry Psychological:  Alert and cooperative. Normal mood and affect.  LABS:   Labs Reviewed  CERVICOVAGINAL ANCILLARY ONLY   ASSESSMENT & PLAN:  1. Vaginal odor     Meds ordered this encounter  Medications   metroNIDAZOLE (FLAGYL) 500 MG tablet    Sig: Take 1 tablet (500 mg total) by mouth 2 (two) times daily.    Dispense:  14 tablet    Refill:  0    Order Specific Question:   Supervising Provider    Answer:   Eustace Moore [[id number]]    Pending: Labs Reviewed  CERVICOVAGINAL ANCILLARY ONLY    Vaginal self-swab obtained.  We will follow up with you regarding abnormal results Prescribed metronidazole 500 mg twice daily  for 7 days (do not take while consuming alcohol and/or if breastfeeding) Follow up with PCP as needed Return here or go to ER if you have any new or worsening symptoms fever, chills, nausea, vomiting, abdominal or pelvic pain, painful intercourse, vaginal discharge, vaginal bleeding, persistent symptoms despite treatment, etc...  Reviewed expectations re: course of current medical issues. Questions answered. Outlined signs and symptoms indicating need for more acute intervention. Patient verbalized understanding.       Rennis Harding, PA-C 03/14/21 1426

## 2021-03-14 NOTE — Discharge Instructions (Addendum)
Vaginal self-swab obtained.  We will follow up with you regarding abnormal results Prescribed metronidazole 500 mg twice daily for 7 days (do not take while consuming alcohol and/or if breastfeeding) Follow up with PCP as needed Return here or go to ER if you have any new or worsening symptoms fever, chills, nausea, vomiting, abdominal or pelvic pain, painful intercourse, vaginal discharge, vaginal bleeding, persistent symptoms despite treatment, etc..[name]

## 2021-03-14 NOTE — ED Triage Notes (Signed)
Pt presents today with c/o vaginal discharge (yellowish) with odor x 1 month. She reports having a history of BV and would like to be tested.

## 2021-03-16 LAB — CERVICOVAGINAL ANCILLARY ONLY
Bacterial Vaginitis (gardnerella): POSITIVE — AB
Candida Glabrata: NEGATIVE
Candida Vaginitis: NEGATIVE
Chlamydia: NEGATIVE
Comment: NEGATIVE
Comment: NEGATIVE
Comment: NEGATIVE
Comment: NEGATIVE
Comment: NEGATIVE
Comment: NORMAL
Neisseria Gonorrhea: NEGATIVE
Trichomonas: NEGATIVE

## 2021-09-01 ENCOUNTER — Ambulatory Visit
Admission: EM | Admit: 2021-09-01 | Discharge: 2021-09-01 | Disposition: A | Discharge status: Home / Self Care | Payer: Medicaid Other | Attending: Student | Admitting: Student

## 2021-09-01 ENCOUNTER — Other Ambulatory Visit: Payer: Self-pay

## 2021-09-01 DIAGNOSIS — N760 Acute vaginitis: Secondary | ICD-10-CM | POA: Diagnosis not present

## 2021-09-01 DIAGNOSIS — B9689 Other specified bacterial agents as the cause of diseases classified elsewhere: Secondary | ICD-10-CM | POA: Diagnosis not present

## 2021-09-01 DIAGNOSIS — Z3202 Encounter for pregnancy test, result negative: Secondary | ICD-10-CM

## 2021-09-01 LAB — POCT URINE PREGNANCY: Preg Test, Ur: NEGATIVE

## 2021-09-01 MED ORDER — METRONIDAZOLE 500 MG PO TABS: 500.0000 mg | ORAL_TABLET | Freq: Two times a day (BID) | ORAL | 0 refills | Status: DC

## 2021-09-01 NOTE — ED Triage Notes (Signed)
Pt reports yellow vaginal discharge x 2 weeks.

## 2021-09-01 NOTE — Discharge Instructions (Addendum)
-  For bacterial vaginosis, start the antibiotic-Flagyl (metronidazole), 2 pills daily for 7 days.  You can take this with food if you have a sensitive stomach.  Avoid alcohol while taking this medication and for 2 days after as this will cause severe nausea and vomiting.  

## 2021-09-01 NOTE — ED Provider Notes (Signed)
[facility]    CSN: [id number] Arrival date & time: 09/01/21  1246      History   Chief Complaint Chief Complaint  Patient presents with   Vaginal Discharge    HPI Karen Salas is a 36 y.o. female presenting with vaginal discharge for 2 weeks.  History gonorrhea, chlamydia.  Describes yellow vaginal discharge with foul smell.  Symptoms after using new soap with fragrance.  Denies new partners, STI risk.  Denies abdominal pain, flank pain, fever/chills.  HPI  Past Medical History:  Diagnosis Date   Anxiety    Depression    Hepatitis    has the antibody but not disease   Hx of chlamydia infection    Hx of gonorrhea    Mental disorder     There are no problems to display for this patient.   Past Surgical History:  Procedure Laterality Date   WISDOM TOOTH EXTRACTION      OB History     Gravida  3   Para  3   Term  3   Preterm      AB      Living  2      SAB      IAB      Ectopic      Multiple      Live Births  2            Home Medications    Prior to Admission medications   Medication Sig Start Date End Date Taking? Authorizing Provider  metroNIDAZOLE (FLAGYL) 500 MG tablet Take 1 tablet (500 mg total) by mouth 2 (two) times daily. Avoid alcohol while taking this medication and for 2 days after 09/01/21  Yes Hazel Sams, PA-C  acetaminophen (TYLENOL) 500 MG tablet Take 1 tablet (500 mg total) by mouth every 6 (six) hours as needed. 01/01/20   Avegno, Darrelyn Hillock, FNP  ibuprofen (ADVIL) 800 MG tablet Take 1 tablet (800 mg total) by mouth 3 (three) times daily. 04/29/20   Wieters, Hallie C, PA-C  lidocaine (XYLOCAINE) 2 % solution Use as directed 15 mLs in the mouth or throat as needed for mouth pain. 05/15/20   Avegno, Darrelyn Hillock, FNP    Family History History reviewed. No pertinent family history.  Social History Social History   Tobacco Use   Smoking status: Every Day    Packs/day: 1.00    Years: 10.00     Pack years: 10.00    Types: Cigarettes   Smokeless tobacco: Never  Vaping Use   Vaping Use: Never used  Substance Use Topics   Alcohol use: No    Comment: denied alcohol use   Drug use: Not Currently     Allergies   Amoxicillin, Cephalexin, and Morphine and related   Review of Systems Review of Systems  Constitutional:  Negative for chills and fever.  HENT:  Negative for sore throat.   Eyes:  Negative for pain and redness.  Respiratory:  Negative for shortness of breath.   Cardiovascular:  Negative for chest pain.  Gastrointestinal:  Negative for abdominal pain, diarrhea, nausea and vomiting.  Genitourinary:  Positive for vaginal discharge. Negative for decreased urine volume, difficulty urinating, dysuria, flank pain, frequency, genital sores, hematuria and urgency.  Musculoskeletal:  Negative for back pain.  Skin:  Negative for rash.  All other systems reviewed and are negative.   Physical Exam Triage Vital Signs ED Triage Vitals  Enc Vitals Group     BP  09/01/21 1253 129/74     Pulse Rate 09/01/21 1253 99     Resp 09/01/21 1253 17     Temp 09/01/21 1253 97.7 F (36.5 C)     Temp Source 09/01/21 1253 Oral     SpO2 09/01/21 1253 98 %     Weight --      Height --      Head Circumference --      Peak Flow --      Pain Score 09/01/21 1254 0     Pain Loc --      Pain Edu? --      Excl. in [place]? --    No data found.  Updated Vital Signs BP 129/74 (BP Location: Right Arm)    Pulse 99    Temp 97.7 F (36.5 C) (Oral)    Resp 17    LMP  (Within Weeks) Comment: 1 week   SpO2 98%   Visual Acuity Right Eye Distance:   Left Eye Distance:   Bilateral Distance:    Right Eye Near:   Left Eye Near:    Bilateral Near:     Physical Exam Vitals reviewed.  Constitutional:      General: She is not in acute distress.    Appearance: Normal appearance. She is not ill-appearing.  HENT:     Head: Normocephalic and atraumatic.     Mouth/Throat:     Mouth: Mucous membranes  are moist.     Comments: Moist mucous membranes Eyes:     Extraocular Movements: Extraocular movements intact.     Pupils: Pupils are equal, round, and reactive to light.  Cardiovascular:     Rate and Rhythm: Normal rate and regular rhythm.     Heart sounds: Normal heart sounds.  Pulmonary:     Effort: Pulmonary effort is normal.     Breath sounds: Normal breath sounds. No wheezing, rhonchi or rales.  Abdominal:     General: Bowel sounds are normal. There is no distension.     Palpations: Abdomen is soft. There is no mass.     Tenderness: There is no abdominal tenderness. There is no right CVA tenderness, left CVA tenderness, guarding or rebound.  Genitourinary:    Comments: deferred Skin:    General: Skin is warm.     Capillary Refill: Capillary refill takes less than 2 seconds.     Comments: Good skin turgor  Neurological:     General: No focal deficit present.     Mental Status: She is alert and oriented to person, place, and time.  Psychiatric:        Mood and Affect: Mood normal.        Behavior: Behavior normal.     UC Treatments / Results  Labs (all labs ordered are listed, but only abnormal results are displayed) Labs Reviewed  POCT URINE PREGNANCY  CERVICOVAGINAL ANCILLARY ONLY    EKG   Radiology No results found.  Procedures Procedures (including critical care time)  Medications Ordered in UC Medications - No data to display  Initial Impression / Assessment and Plan / UC Course  I have reviewed the triage vital signs and the nursing notes.  Pertinent labs & imaging results that were available during my care of the patient were reviewed by me and considered in my medical decision making (see chart for details).     This patient is a very pleasant 36 y.o. year old female presenting with suspected BV. Afebrile, nontachycardic, no reproducible abd  pain or CVAT.  Negative u-preg.  Will send self-swab for G/C, trich, yeast, BV testing. Declines HIV,  RPR. Safe sex precautions.  Will manage with flagyl as below.   ED return precautions discussed. Patient verbalizes understanding and agreement.    Final Clinical Impressions(s) / UC Diagnoses   Final diagnoses:  Bacterial vaginosis  Negative pregnancy test     Discharge Instructions      -For bacterial vaginosis, start the antibiotic-Flagyl (metronidazole), 2 pills daily for 7 days.  You can take this with food if you have a sensitive stomach.  Avoid alcohol while taking this medication and for 2 days after as this will cause severe nausea and vomiting.    ED Prescriptions     Medication Sig Dispense Auth. Provider   metroNIDAZOLE (FLAGYL) 500 MG tablet Take 1 tablet (500 mg total) by mouth 2 (two) times daily. Avoid alcohol while taking this medication and for 2 days after 14 tablet Hazel Sams, PA-C      PDMP not reviewed this encounter.   Hazel Sams, PA-C 09/01/21 1319

## 2021-09-02 LAB — CERVICOVAGINAL ANCILLARY ONLY
Bacterial Vaginitis (gardnerella): POSITIVE — AB
Candida Glabrata: NEGATIVE
Candida Vaginitis: NEGATIVE
Chlamydia: NEGATIVE
Comment: NEGATIVE
Comment: NEGATIVE
Comment: NEGATIVE
Comment: NEGATIVE
Comment: NEGATIVE
Comment: NORMAL
Neisseria Gonorrhea: NEGATIVE
Trichomonas: NEGATIVE

## 2022-09-14 ENCOUNTER — Telehealth: Payer: Medicaid Other | Admitting: Physician Assistant

## 2022-09-14 NOTE — Progress Notes (Signed)
Patient no-showed today's appointment; appointment was for Video Urgent Care Visit.  

## 2022-10-04 ENCOUNTER — Telehealth: Payer: Medicaid Other | Admitting: Family Medicine

## 2022-10-04 DIAGNOSIS — Z91199 Patient's noncompliance with other medical treatment and regimen due to unspecified reason: Secondary | ICD-10-CM

## 2022-10-04 DIAGNOSIS — B9689 Other specified bacterial agents as the cause of diseases classified elsewhere: Secondary | ICD-10-CM | POA: Diagnosis not present

## 2022-10-04 DIAGNOSIS — N760 Acute vaginitis: Secondary | ICD-10-CM | POA: Diagnosis not present

## 2022-10-04 DIAGNOSIS — B3731 Acute candidiasis of vulva and vagina: Secondary | ICD-10-CM | POA: Diagnosis not present

## 2022-10-04 MED ORDER — METRONIDAZOLE 500 MG PO TABS: 500.0000 mg | ORAL_TABLET | Freq: Two times a day (BID) | ORAL | 0 refills | Status: AC

## 2022-10-04 MED ORDER — FLUCONAZOLE 150 MG PO TABS: 150.0000 mg | ORAL_TABLET | ORAL | 0 refills | Status: DC

## 2022-10-04 NOTE — Patient Instructions (Signed)
Temple Pacini, thank you for joining Perlie Mayo, NP for today's virtual visit.  While this provider is not your primary care provider (PCP), if your PCP is located in our provider database this encounter information will be shared with them immediately following your visit.   [facility] account gives you access to today's visit and all your visits, tests, and labs performed at [facility] " click here if you don't have a [facility] account or go to [email or web address]  Consent: (Patient) Temple Pacini provided verbal consent for this virtual visit at the beginning of the encounter.  Current Medications:  Current Outpatient Medications:    fluconazole (DIFLUCAN) 150 MG tablet, Take 1 tablet (150 mg total) by mouth as directed. May repeat in after Flagyl is complete, Disp: 2 tablet, Rfl: 0   metroNIDAZOLE (FLAGYL) 500 MG tablet, Take 1 tablet (500 mg total) by mouth 2 (two) times daily for 7 days., Disp: 14 tablet, Rfl: 0   acetaminophen (TYLENOL) 500 MG tablet, Take 1 tablet (500 mg total) by mouth every 6 (six) hours as needed., Disp: 30 tablet, Rfl: 0   ibuprofen (ADVIL) 800 MG tablet, Take 1 tablet (800 mg total) by mouth 3 (three) times daily., Disp: 21 tablet, Rfl: 0   lidocaine (XYLOCAINE) 2 % solution, Use as directed 15 mLs in the mouth or throat as needed for mouth pain., Disp: 100 mL, Rfl: 1   Medications ordered in this encounter:  Meds ordered this encounter  Medications   metroNIDAZOLE (FLAGYL) 500 MG tablet    Sig: Take 1 tablet (500 mg total) by mouth 2 (two) times daily for 7 days.    Dispense:  14 tablet    Refill:  0    Order Specific Question:   Supervising Provider    Answer:   Chase Picket [id number]   fluconazole (DIFLUCAN) 150 MG tablet    Sig: Take 1 tablet (150 mg total) by mouth as directed. May repeat in after Flagyl is complete    Dispense:  2 tablet    Refill:  0    Order Specific Question:    Supervising Provider    Answer:   Chase Picket [id number]     *If you need refills on other medications prior to your next appointment, please contact your pharmacy*  Follow-Up: Call back or seek an in-person evaluation if the symptoms worsen or if the condition fails to improve as anticipated.  [facility] [phone]  Other Instructions Bacterial Vaginosis  Bacterial vaginosis is an infection of the vagina. It happens when too many normal germs (healthy bacteria) grow in the vagina. This infection can make it easier to get other infections from sex (STIs). It is very important for pregnant women to get treated. This infection can cause babies to be born early or at a low birth weight. What are the causes? This infection is caused by an increase in certain germs that grow in the vagina. You cannot get this infection from toilet seats, bedsheets, swimming pools, or things that touch your vagina. What increases the risk? Having sex with a new person or more than one person. Having sex without protection. Douching. Having an intrauterine device (IUD). Smoking. Using drugs or drinking alcohol. These can lead you to do things that are risky. Taking certain antibiotic medicines. Being pregnant. What are the signs or symptoms? Some women have no symptoms. Symptoms may include: A discharge from your vagina. It may  be gray or white. It can be watery or foamy. A fishy smell. This can happen after sex or during your menstrual period. Itching in and around your vagina. A feeling of burning or pain when you pee (urinate). How is this treated? This infection is treated with antibiotic medicines. These may be given to you as: A pill. A cream for your vagina. A medicine that you put into your vagina (suppository). If the infection comes back after treatment, you may need more antibiotics. Follow these instructions at home: Medicines Take over-the-counter and  prescription medicines as told by your doctor. Take or use your antibiotic medicine as told by your doctor. Do not stop taking or using it, even if you start to feel better. General instructions If the person you have sex with is a woman, tell her that you have this infection. She will need to follow up with her doctor. If you have a female partner, he does not need to be treated. Do not have sex until you finish treatment. Drink enough fluid to keep your pee pale yellow. Keep your vagina and butt clean. Wash the area with warm water each day. Wipe from front to back after you use the toilet. If you are breastfeeding a baby, ask your doctor if you should keep doing so during treatment. Keep all follow-up visits. How is this prevented? Self-care Do not douche. Use only warm water to wash around your vagina. Wear underwear that is cotton or lined with cotton. Do not wear tight pants and pantyhose, especially in the summer. Safe sex Use protection when you have sex. This includes: Use condoms. Use dental dams. This is a thin layer that protects the mouth during oral sex. Limit how many people you have sex with. To prevent this infection, it is best to have sex with just one person. Get tested for STIs. The person you have sex with should also get tested. Drugs and alcohol Do not smoke or use any products that contain nicotine or tobacco. If you need help quitting, ask your doctor. Do not use drugs. Do not drink alcohol if: Your doctor tells you not to drink. You are pregnant, may be pregnant, or are planning to become pregnant. If you drink alcohol: Limit how much you have to 0-1 drink a day. Know how much alcohol is in your drink. In the [place] one drink equals one 12 oz bottle of beer (355 mL), one 5 oz glass of wine (148 mL), or one 1 oz glass of hard liquor (44 mL). Where to find more information Centers for Disease Control and Prevention: http://www.wolf.info/ American Sexual Health  Association: www.ashastd.org Office on [employer] Health: [place]s.tn Contact a doctor if: Your symptoms do not get better, even after you are treated. You have more discharge or pain when you pee. You have a fever or chills. You have pain in your belly (abdomen) or in the area between your hips. You have pain with sex. You bleed from your vagina between menstrual periods. Summary This infection can happen when too many germs (bacteria) grow in the vagina. This infection can make it easier to get infections from sex (STIs). Treating this can lower that chance. Get treated if you are pregnant. This infection can cause babies to be born early. Do not stop taking or using your antibiotic medicine, even if you start to feel better. This information is not intended to replace advice given to you by your health care provider. Make sure you discuss any questions  you have with your health care provider. Document Revised: 12/20/2019 Document Reviewed: 12/20/2019 Elsevier Patient Education  [place].    If you have been instructed to have an in-person evaluation today at a local Urgent Care facility, please use the link below. It will take you to a list of all of our available [facility], including address, phone number and hours of operation. Please do not delay care.  [facility]  If you or a family member do not have a primary care provider, use the link below to schedule a visit and establish care. When you choose a [facility] primary care physician or advanced practice provider, you gain a long-term partner in health. Find a Primary Care Provider  Learn more about [facility]'s in-office and virtual care options: [facility] Now

## 2022-10-04 NOTE — Progress Notes (Signed)
The patient no-showed for appointment despite this provider sending direct link, reaching out via phone with no response and waiting for at least 10 minutes from appointment time for patient to join. They will be marked as a NS for this appointment/time.   Oriya Kettering M Ryosuke Ericksen, NP    

## 2022-10-04 NOTE — Progress Notes (Signed)
Virtual Visit Consent   Temple Pacini, you are scheduled for a virtual visit with a [facility] provider today. Just as with appointments in the office, your consent must be obtained to participate. Your consent will be active for this visit and any virtual visit you may have with one of our providers in the next 365 days. If you have a MyChart account, a copy of this consent can be sent to you electronically.  As this is a virtual visit, video technology does not allow for your provider to perform a traditional examination. This may limit your provider's ability to fully assess your condition. If your provider identifies any concerns that need to be evaluated in person or the need to arrange testing (such as labs, EKG, etc.), we will make arrangements to do so. Although advances in technology are sophisticated, we cannot ensure that it will always work on either your end or our end. If the connection with a video visit is poor, the visit may have to be switched to a telephone visit. With either a video or telephone visit, we are not always able to ensure that we have a secure connection.  By engaging in this virtual visit, you consent to the provision of healthcare and authorize for your insurance to be billed (if applicable) for the services provided during this visit. Depending on your insurance coverage, you may receive a charge related to this service.  I need to obtain your verbal consent now. Are you willing to proceed with your visit today? Karen Salas has provided verbal consent on 10/04/2022 for a virtual visit (video or telephone). Perlie Mayo, NP  Date: 10/04/2022 3:43 PM  Virtual Visit via Video Note   I, Perlie Mayo, connected with  Karen Salas  ([id number], 1986-02-11) on 10/04/22 at  3:45 PM EDT by a video-enabled telemedicine application and verified that I am speaking with the correct person using two identifiers.  Location: Patient: Virtual Visit Location  Patient: Home Provider: Virtual Visit Location Provider: Home Office   I discussed the limitations of evaluation and management by telemedicine and the availability of in person appointments. The patient expressed understanding and agreed to proceed.    History of Present Illness: Karen Salas is a 37 y.o. who identifies as a female who was assigned female at birth, and is being seen today for yeast and BV  Onset was Wednesday- yeast infection and Sunday with odor Associated symptoms are odor, itching, discharge Modifying factors are none Denies bleeding, back or pelvic pain, fevers, chills, UTI symptoms History of vaginitis with yeast and BV  Problems: There are no problems to display for this patient.   Allergies:  Allergies  Allergen Reactions   Amoxicillin Itching   Cephalexin Hives   Morphine And Related Rash   Medications:  Current Outpatient Medications:    acetaminophen (TYLENOL) 500 MG tablet, Take 1 tablet (500 mg total) by mouth every 6 (six) hours as needed., Disp: 30 tablet, Rfl: 0   ibuprofen (ADVIL) 800 MG tablet, Take 1 tablet (800 mg total) by mouth 3 (three) times daily., Disp: 21 tablet, Rfl: 0   lidocaine (XYLOCAINE) 2 % solution, Use as directed 15 mLs in the mouth or throat as needed for mouth pain., Disp: 100 mL, Rfl: 1   metroNIDAZOLE (FLAGYL) 500 MG tablet, Take 1 tablet (500 mg total) by mouth 2 (two) times daily. Avoid alcohol while taking this medication and for 2 days after, Disp: 14 tablet, Rfl:  0  Observations/Objective: Patient is well-developed, well-nourished in no acute distress.  Resting comfortably  at home.  Head is normocephalic, atraumatic.  No labored breathing.  Speech is clear and coherent with logical content.  Patient is alert and oriented at baseline.    Assessment and Plan:  1. BV (bacterial vaginosis)  - metroNIDAZOLE (FLAGYL) 500 MG tablet; Take 1 tablet (500 mg total) by mouth 2 (two) times daily for 7 days.   Dispense: 14 tablet; Refill: 0  2. Yeast vaginitis  - fluconazole (DIFLUCAN) 150 MG tablet; Take 1 tablet (150 mg total) by mouth as directed. May repeat in after Flagyl is complete  Dispense: 2 tablet; Refill: 0  -no other red flags for stone or kidney infection or STI -increase fluids -complete medication as discussed -prevention discussed and on AVS   Reviewed side effects, risks and benefits of medication.    Patient acknowledged agreement and understanding of the plan.   Past Medical, Surgical, Social History, Allergies, and Medications have been Reviewed.    Follow Up Instructions: I discussed the assessment and treatment plan with the patient. The patient was provided an opportunity to ask questions and all were answered. The patient agreed with the plan and demonstrated an understanding of the instructions.  A copy of instructions were sent to the patient via MyChart unless otherwise noted below.    The patient was advised to call back or seek an in-person evaluation if the symptoms worsen or if the condition fails to improve as anticipated.  Time:  I spent 10 minutes with the patient via telehealth technology discussing the above problems/concerns.    Perlie Mayo, NP

## 2022-10-09 ENCOUNTER — Telehealth: Payer: Medicaid Other | Admitting: Nurse Practitioner

## 2022-10-09 NOTE — Progress Notes (Signed)
   No show  Attempt to contact patient via email as well as mobile and home phone number listed. Did  not respong by email. Mobile phone number was not in order. Home phone number was  a business.   Mary-Margaret Daphine Deutscher, FNP

## 2022-11-07 ENCOUNTER — Telehealth: Payer: 59 | Admitting: Nurse Practitioner

## 2022-11-07 DIAGNOSIS — J019 Acute sinusitis, unspecified: Secondary | ICD-10-CM | POA: Diagnosis not present

## 2022-11-07 MED ORDER — FLUTICASONE PROPIONATE 50 MCG/ACT NA SUSP: 2.0000 | Freq: Every day | NASAL | 0 refills | Status: AC

## 2022-11-07 MED ORDER — DOXYCYCLINE HYCLATE 100 MG PO TABS: 100.0000 mg | ORAL_TABLET | Freq: Two times a day (BID) | ORAL | 0 refills | Status: DC

## 2022-11-07 NOTE — Patient Instructions (Addendum)
  Karen Salas, thank you for joining Claiborne Rigg, NP for today's virtual visit.  While this provider is not your primary care provider (PCP), if your PCP is located in our provider database this encounter information will be shared with them immediately following your visit.   A [facility] MyChart account gives you access to today's visit and all your visits, tests, and labs performed at [facility] " click here if you don't have a [name] or go to [email or web address]  Consent: (Patient) Karen Salas provided verbal consent for this virtual visit at the beginning of the encounter.  Current Medications:  Current Outpatient Medications:    doxycycline (VIBRA-TABS) 100 MG tablet, Take 1 tablet (100 mg total) by mouth 2 (two) times daily for 7 days., Disp: 14 tablet, Rfl: 0   acetaminophen (TYLENOL) 500 MG tablet, Take 1 tablet (500 mg total) by mouth every 6 (six) hours as needed., Disp: 30 tablet, Rfl: 0   fluconazole (DIFLUCAN) 150 MG tablet, Take 1 tablet (150 mg total) by mouth as directed. May repeat in after Flagyl is complete, Disp: 2 tablet, Rfl: 0   ibuprofen (ADVIL) 800 MG tablet, Take 1 tablet (800 mg total) by mouth 3 (three) times daily., Disp: 21 tablet, Rfl: 0   lidocaine (XYLOCAINE) 2 % solution, Use as directed 15 mLs in the mouth or throat as needed for mouth pain., Disp: 100 mL, Rfl: 1   Medications ordered in this encounter:  Meds ordered this encounter  Medications   doxycycline (VIBRA-TABS) 100 MG tablet    Sig: Take 1 tablet (100 mg total) by mouth 2 (two) times daily for 7 days.    Dispense:  14 tablet    Refill:  0    Order Specific Question:   Supervising Provider    Answer:   Merrilee Jansky [id number]     *If you need refills on other medications prior to your next appointment, please contact your pharmacy*  Follow-Up: Call back or seek an in-person evaluation if the symptoms worsen or if the condition  fails to improve as anticipated.  [facility] [phone]    If you have been instructed to have an in-person evaluation today at a local Urgent Care facility, please use the link below. It will take you to a list of all of our available [facility], including address, phone number and hours of operation. Please do not delay care.  [facility]  If you or a family member do not have a primary care provider, use the link below to schedule a visit and establish care. When you choose a [facility] primary care physician or advanced practice provider, you gain a long-term partner in health. Find a Primary Care Provider  Learn more about [facility]'s in-office and virtual care options: [facility] - Get Care Now

## 2022-11-07 NOTE — Progress Notes (Addendum)
Virtual Visit Consent   Karen Salas, you are scheduled for a virtual visit with a [facility] provider today. Just as with appointments in the office, your consent must be obtained to participate. Your consent will be active for this visit and any virtual visit you may have with one of our providers in the next 365 days. If you have a MyChart account, a copy of this consent can be sent to you electronically.  As this is a virtual visit, video technology does not allow for your provider to perform a traditional examination. This may limit your provider's ability to fully assess your condition. If your provider identifies any concerns that need to be evaluated in person or the need to arrange testing (such as labs, EKG, etc.), we will make arrangements to do so. Although advances in technology are sophisticated, we cannot ensure that it will always work on either your end or our end. If the connection with a video visit is poor, the visit may have to be switched to a telephone visit. With either a video or telephone visit, we are not always able to ensure that we have a secure connection.  By engaging in this virtual visit, you consent to the provision of healthcare and authorize for your insurance to be billed (if applicable) for the services provided during this visit. Depending on your insurance coverage, you may receive a charge related to this service.  I need to obtain your verbal consent now. Are you willing to proceed with your visit today? Karen Salas has provided verbal consent on 11/07/2022 for a virtual visit (video or telephone). Claiborne Rigg, NP  Date: 11/07/2022 2:52 PM  Virtual Visit via Video Note   I, Claiborne Rigg, connected with  Karen Salas  ([id number], [birth year]) on 11/07/22 at  2:00 PM EDT by a video-enabled telemedicine application and verified that I am speaking with the correct person using two identifiers.  Location: Patient: Virtual Visit  Location Patient: Home Provider: Virtual Visit Location Provider: Home Office   I discussed the limitations of evaluation and management by telemedicine and the availability of in person appointments. The patient expressed understanding and agreed to proceed.    History of Present Illness: Karen Salas is a 37 y.o. who identifies as a female who was assigned female at birth, and is being seen today for possible sinus infection.  Karen Salas notes symptoms of sinus pressure, nasal congestion, purulent nasal drainage ongoing for over >8 days. She is a smoker. Associated symptoms include dry cough and headache   Problems: There are no problems to display for this patient.   Allergies:  Allergies  Allergen Reactions   Amoxicillin Itching   Cephalexin Hives   Morphine And Related Rash   Medications:  Current Outpatient Medications:    fluticasone (FLONASE) 50 MCG/ACT nasal spray, Place 2 sprays into both nostrils daily., Disp: 16 g, Rfl: 0   acetaminophen (TYLENOL) 500 MG tablet, Take 1 tablet (500 mg total) by mouth every 6 (six) hours as needed., Disp: 30 tablet, Rfl: 0   fluconazole (DIFLUCAN) 150 MG tablet, Take 1 tablet (150 mg total) by mouth as directed. May repeat in after Flagyl is complete, Disp: 2 tablet, Rfl: 0   ibuprofen (ADVIL) 800 MG tablet, Take 1 tablet (800 mg total) by mouth 3 (three) times daily., Disp: 21 tablet, Rfl: 0   lidocaine (XYLOCAINE) 2 % solution, Use as directed 15 mLs in the mouth or throat as needed  for mouth pain., Disp: 100 mL, Rfl: 1  Observations/Objective: Patient is well-developed, well-nourished in no acute distress.  Resting comfortably at home.  Head is normocephalic, atraumatic.  No labored breathing.  Speech is clear and coherent with logical content.  Patient is alert and oriented at baseline.   Assessment and Plan: 1. Acute non-recurrent sinusitis, unspecified location - fluticasone (FLONASE) 50 MCG/ACT nasal spray; Place 2 sprays  into both nostrils daily.  Dispense: 16 g; Refill: 0    Follow Up Instructions: I discussed the assessment and treatment plan with the patient. The patient was provided an opportunity to ask questions and all were answered. The patient agreed with the plan and demonstrated an understanding of the instructions.  A copy of instructions were sent to the patient via MyChart unless otherwise noted below.   The patient was advised to call back or seek an in-person evaluation if the symptoms worsen or if the condition fails to improve as anticipated.  Time:  I spent 12 minutes with the patient via telehealth technology discussing the above problems/concerns.    Claiborne Rigg, NP

## 2022-11-07 NOTE — Addendum Note (Signed)
Addended by: Bertram Denver on: 11/07/2022 02:53 PM   Modules accepted: Orders

## 2022-11-27 ENCOUNTER — Telehealth: Payer: 59 | Admitting: Family Medicine

## 2022-11-27 DIAGNOSIS — N3000 Acute cystitis without hematuria: Secondary | ICD-10-CM | POA: Diagnosis not present

## 2022-11-27 DIAGNOSIS — N760 Acute vaginitis: Secondary | ICD-10-CM

## 2022-11-27 DIAGNOSIS — B9689 Other specified bacterial agents as the cause of diseases classified elsewhere: Secondary | ICD-10-CM | POA: Diagnosis not present

## 2022-11-27 MED ORDER — METRONIDAZOLE 500 MG PO TABS: 500.0000 mg | ORAL_TABLET | Freq: Three times a day (TID) | ORAL | 0 refills | Status: AC

## 2022-11-27 MED ORDER — CIPROFLOXACIN HCL 500 MG PO TABS: 250.0000 mg | ORAL_TABLET | Freq: Two times a day (BID) | ORAL | 0 refills | Status: AC

## 2022-11-27 NOTE — Progress Notes (Signed)
Virtual Visit Consent   Karen Salas, you are scheduled for a virtual visit with a [facility] provider today. Just as with appointments in the office, your consent must be obtained to participate. Your consent will be active for this visit and any virtual visit you may have with one of our providers in the next 365 days. If you have a MyChart account, a copy of this consent can be sent to you electronically.  As this is a virtual visit, video technology does not allow for your provider to perform a traditional examination. This may limit your provider's ability to fully assess your condition. If your provider identifies any concerns that need to be evaluated in person or the need to arrange testing (such as labs, EKG, etc.), we will make arrangements to do so. Although advances in technology are sophisticated, we cannot ensure that it will always work on either your end or our end. If the connection with a video visit is poor, the visit may have to be switched to a telephone visit. With either a video or telephone visit, we are not always able to ensure that we have a secure connection.  By engaging in this virtual visit, you consent to the provision of healthcare and authorize for your insurance to be billed (if applicable) for the services provided during this visit. Depending on your insurance coverage, you may receive a charge related to this service.  I need to obtain your verbal consent now. Are you willing to proceed with your visit today? [name] has provided verbal consent on 11/27/2022 for a virtual visit (video or telephone). Georgana Curio, FNP  Date: 11/27/2022 12:05 PM  Virtual Visit via Video Note   I, Georgana Curio, connected with  Karen Salas  ([id number], [birth year]) on 11/27/22 at 11:30 AM EDT by a video-enabled telemedicine application and verified that I am speaking with the correct person using two identifiers.  Location: Patient: Virtual Visit Location  Patient: Home Provider: Virtual Visit Location Provider: Home Office   I discussed the limitations of evaluation and management by telemedicine and the availability of in person appointments. The patient expressed understanding and agreed to proceed.    History of Present Illness: Karen Salas is a 37 y.o. who identifies as a female who was assigned female at birth, and is being seen today for thin discharge with odor and burning on urination for 3 days both worsening. No fever. Karen Salas  HPI: HPI  Problems: There are no problems to display for this patient.   Allergies:  Allergies  Allergen Reactions   Amoxicillin Itching   Cephalexin Hives   Morphine And Codeine Rash   Medications:  Current Outpatient Medications:    acetaminophen (TYLENOL) 500 MG tablet, Take 1 tablet (500 mg total) by mouth every 6 (six) hours as needed., Disp: 30 tablet, Rfl: 0   fluconazole (DIFLUCAN) 150 MG tablet, Take 1 tablet (150 mg total) by mouth as directed. May repeat in after Flagyl is complete, Disp: 2 tablet, Rfl: 0   fluticasone (FLONASE) 50 MCG/ACT nasal spray, Place 2 sprays into both nostrils daily., Disp: 16 g, Rfl: 0   ibuprofen (ADVIL) 800 MG tablet, Take 1 tablet (800 mg total) by mouth 3 (three) times daily., Disp: 21 tablet, Rfl: 0   lidocaine (XYLOCAINE) 2 % solution, Use as directed 15 mLs in the mouth or throat as needed for mouth pain., Disp: 100 mL, Rfl: 1  Observations/Objective: Patient is well-developed, well-nourished in no acute  distress.  Resting comfortably  at home.  Head is normocephalic, atraumatic.  No labored breathing.  Speech is clear and coherent with logical content.  Patient is alert and oriented at baseline.    Assessment and Plan: 1. Acute cystitis without hematuria  2. BV (bacterial vaginosis)  UC if sx persist or worsen. Push fluids, See education included.   Follow Up Instructions: I discussed the assessment and treatment plan with the patient. The  patient was provided an opportunity to ask questions and all were answered. The patient agreed with the plan and demonstrated an understanding of the instructions.  A copy of instructions were sent to the patient via MyChart unless otherwise noted below.     The patient was advised to call back or seek an in-person evaluation if the symptoms worsen or if the condition fails to improve as anticipated.  Time:  I spent 10 minutes with the patient via telehealth technology discussing the above problems/concerns.    Georgana Curio, FNP

## 2022-11-27 NOTE — Patient Instructions (Signed)
Urinary Tract Infection, Adult  A urinary tract infection (UTI) is an infection of any part of the urinary tract. The urinary tract includes the kidneys, ureters, bladder, and urethra. These organs make, store, and get rid of urine in the body. An upper UTI affects the ureters and kidneys. A lower UTI affects the bladder and urethra. What are the causes? Most urinary tract infections are caused by bacteria in your genital area around your urethra, where urine leaves your body. These bacteria grow and cause inflammation of your urinary tract. What increases the risk? You are more likely to develop this condition if: You have a urinary catheter that stays in place. You are not able to control when you urinate or have a bowel movement (incontinence). You are female and you: Use a spermicide or diaphragm for birth control. Have low estrogen levels. Are pregnant. You have certain genes that increase your risk. You are sexually active. You take antibiotic medicines. You have a condition that causes your flow of urine to slow down, such as: An enlarged prostate, if you are female. Blockage in your urethra. A kidney stone. A nerve condition that affects your bladder control (neurogenic bladder). Not getting enough to drink, or not urinating often. You have certain medical conditions, such as: Diabetes. A weak disease-fighting system (immunesystem). Sickle cell disease. Gout. Spinal cord injury. What are the signs or symptoms? Symptoms of this condition include: Needing to urinate right away (urgency). Frequent urination. This may include small amounts of urine each time you urinate. Pain or burning with urination. Blood in the urine. Urine that smells bad or unusual. Trouble urinating. Cloudy urine. Vaginal discharge, if you are female. Pain in the abdomen or the lower back. You may also have: Vomiting or a decreased appetite. Confusion. Irritability or tiredness. A fever or  chills. Diarrhea. The first symptom in older adults may be confusion. In some cases, they may not have any symptoms until the infection has worsened. How is this diagnosed? This condition is diagnosed based on your medical history and a physical exam. You may also have other tests, including: Urine tests. Blood tests. Tests for STIs (sexually transmitted infections). If you have had more than one UTI, a cystoscopy or imaging studies may be done to determine the cause of the infections. How is this treated? Treatment for this condition includes: Antibiotic medicine. Over-the-counter medicines to treat discomfort. Drinking enough water to stay hydrated. If you have frequent infections or have other conditions such as a kidney stone, you may need to see a health care provider who specializes in the urinary tract (urologist). In rare cases, urinary tract infections can cause sepsis. Sepsis is a life-threatening condition that occurs when the body responds to an infection. Sepsis is treated in the hospital with IV antibiotics, fluids, and other medicines. Follow these instructions at home:  Medicines Take over-the-counter and prescription medicines only as told by your health care provider. If you were prescribed an antibiotic medicine, take it as told by your health care provider. Do not stop using the antibiotic even if you start to feel better. General instructions Make sure you: Empty your bladder often and completely. Do not hold urine for long periods of time. Empty your bladder after sex. Wipe from front to back after urinating or having a bowel movement if you are female. Use each tissue only one time when you wipe. Drink enough fluid to keep your urine pale yellow. Keep all follow-up visits. This is important. Contact a health   care provider if: Your symptoms do not get better after 1-2 days. Your symptoms go away and then return. Get help right away if: You have severe pain in  your back or your lower abdomen. You have a fever or chills. You have nausea or vomiting. Summary A urinary tract infection (UTI) is an infection of any part of the urinary tract, which includes the kidneys, ureters, bladder, and urethra. Most urinary tract infections are caused by bacteria in your genital area. Treatment for this condition often includes antibiotic medicines. If you were prescribed an antibiotic medicine, take it as told by your health care provider. Do not stop using the antibiotic even if you start to feel better. Keep all follow-up visits. This is important. This information is not intended to replace advice given to you by your health care provider. Make sure you discuss any questions you have with your health care provider. Document Revised: 01/27/2020 Document Reviewed: 02/01/2020 Elsevier Patient Education  2024 Elsevier Inc.  

## 2023-01-08 ENCOUNTER — Telehealth: Payer: 59 | Admitting: Nurse Practitioner

## 2023-01-08 DIAGNOSIS — L0231 Cutaneous abscess of buttock: Secondary | ICD-10-CM

## 2023-01-08 DIAGNOSIS — L03317 Cellulitis of buttock: Secondary | ICD-10-CM | POA: Diagnosis not present

## 2023-01-08 MED ORDER — SULFAMETHOXAZOLE-TRIMETHOPRIM 800-160 MG PO TABS: 1.0000 | ORAL_TABLET | Freq: Two times a day (BID) | ORAL | 0 refills | Status: AC

## 2023-01-08 MED ORDER — FLUCONAZOLE 150 MG PO TABS: 150.0000 mg | ORAL_TABLET | Freq: Once | ORAL | 0 refills | Status: AC

## 2023-01-08 NOTE — Patient Instructions (Signed)
  Karen Salas, thank you for joining Bennie Pierini, FNP for today's virtual visit.  While this provider is not your primary care provider (PCP), if your PCP is located in our provider database this encounter information will be shared with them immediately following your visit.   A [facility] MyChart account gives you access to today's visit and all your visits, tests, and labs performed at [facility] " click here if you don't have a [facility] MyChart account or go to [email or web address]  Consent: (Patient) Karen Salas provided verbal consent for this virtual visit at the beginning of the encounter.  Current Medications:  Current Outpatient Medications:    fluconazole (DIFLUCAN) 150 MG tablet, Take 1 tablet (150 mg total) by mouth once for 1 dose., Disp: 1 tablet, Rfl: 0   sulfamethoxazole-trimethoprim (BACTRIM DS) 800-160 MG tablet, Take 1 tablet by mouth 2 (two) times daily., Disp: 20 tablet, Rfl: 0   acetaminophen (TYLENOL) 500 MG tablet, Take 1 tablet (500 mg total) by mouth every 6 (six) hours as needed., Disp: 30 tablet, Rfl: 0   fluticasone (FLONASE) 50 MCG/ACT nasal spray, Place 2 sprays into both nostrils daily., Disp: 16 g, Rfl: 0   ibuprofen (ADVIL) 800 MG tablet, Take 1 tablet (800 mg total) by mouth 3 (three) times daily., Disp: 21 tablet, Rfl: 0   lidocaine (XYLOCAINE) 2 % solution, Use as directed 15 mLs in the mouth or throat as needed for mouth pain., Disp: 100 mL, Rfl: 1   Medications ordered in this encounter:  Meds ordered this encounter  Medications   sulfamethoxazole-trimethoprim (BACTRIM DS) 800-160 MG tablet    Sig: Take 1 tablet by mouth 2 (two) times daily.    Dispense:  20 tablet    Refill:  0    Order Specific Question:   Supervising Provider    Answer:   Merrilee Jansky [[id number]]   fluconazole (DIFLUCAN) 150 MG tablet    Sig: Take 1 tablet (150 mg total) by mouth once for 1 dose.    Dispense:  1 tablet    Refill:   0    Order Specific Question:   Supervising Provider    Answer:   Merrilee Jansky [id number]     *If you need refills on other medications prior to your next appointment, please contact your pharmacy*  Follow-Up: Call back or seek an in-person evaluation if the symptoms worsen or if the condition fails to improve as anticipated.  [facility] [phone]  Other Instructions Warm soaks Do not pick at area.  If not improving NTBS to lance area   If you have been instructed to have an in-person evaluation today at a local Urgent Care facility, please use the link below. It will take you to a list of all of our available [facility], including address, phone number and hours of operation. Please do not delay care.  [facility]  If you or a family member do not have a primary care provider, use the link below to schedule a visit and establish care. When you choose a [facility] primary care physician or advanced practice provider, you gain a long-term partner in health. Find a Primary Care Provider  Learn more about [facility] in-office and virtual care options:  - Get Care Now

## 2023-01-08 NOTE — Progress Notes (Signed)
Virtual Visit Consent   Karen Salas, you are scheduled for a virtual visit with Mary-Margaret Daphine Deutscher, FNP, a [facility] provider, today.     Just as with appointments in the office, your consent must be obtained to participate.  Your consent will be active for this visit and any virtual visit you may have with one of our providers in the next 365 days.     If you have a MyChart account, a copy of this consent can be sent to you electronically.  All virtual visits are billed to your insurance company just like a traditional visit in the office.    As this is a virtual visit, video technology does not allow for your provider to perform a traditional examination.  This may limit your provider's ability to fully assess your condition.  If your provider identifies any concerns that need to be evaluated in person or the need to arrange testing (such as labs, EKG, etc.), we will make arrangements to do so.     Although advances in technology are sophisticated, we cannot ensure that it will always work on either your end or our end.  If the connection with a video visit is poor, the visit may have to be switched to a telephone visit.  With either a video or telephone visit, we are not always able to ensure that we have a secure connection.     I need to obtain your verbal consent now.   Are you willing to proceed with your visit today? YES   Karen Salas has provided verbal consent on 01/08/2023 for a virtual visit (video or telephone).   Mary-Margaret Daphine Deutscher, FNP   Date: 01/08/2023 4:44 PM   Virtual Visit via Video Note   I, Mary-Margaret Daphine Deutscher, connected with Karen Salas ([id number], 08/27/1986) on 01/08/23 at  5:00 PM EDT by a video-enabled telemedicine application and verified that I am speaking with the correct person using two identifiers.  Location: Patient: Virtual Visit Location Patient: Home Provider: Virtual Visit Location Provider: Mobile   I discussed the  limitations of evaluation and management by telemedicine and the availability of in person appointments. The patient expressed understanding and agreed to proceed.    History of Present Illness: Karen Salas is a 37 y.o. who identifies as a female who was assigned female at birth, and is being seen today for infection.  HPI: Patient noticed a pimple on her left butt cheek. She squeezed on it and yellowish stuff came out. Now it is red hard and raised. Very sore to touch. Popped open today and started draining yellwish again.   Review of Systems  Constitutional:  Negative for diaphoresis and weight loss.  Eyes:  Negative for blurred vision, double vision and pain.  Respiratory:  Negative for shortness of breath.   Cardiovascular:  Negative for chest pain, palpitations, orthopnea and leg swelling.  Gastrointestinal:  Negative for abdominal pain.  Skin:  Negative for rash.  Neurological:  Negative for dizziness, sensory change, loss of consciousness, weakness and headaches.  Endo/Heme/Allergies:  Negative for polydipsia. Does not bruise/bleed easily.  Psychiatric/Behavioral:  Negative for memory loss. The patient does not have insomnia.   All other systems reviewed and are negative.   Problems: There are no problems to display for this patient.   Allergies:  Allergies  Allergen Reactions   Amoxicillin Itching   Cephalexin Hives   Morphine And Codeine Rash   Medications:  Current Outpatient Medications:  acetaminophen (TYLENOL) 500 MG tablet, Take 1 tablet (500 mg total) by mouth every 6 (six) hours as needed., Disp: 30 tablet, Rfl: 0   fluconazole (DIFLUCAN) 150 MG tablet, Take 1 tablet (150 mg total) by mouth as directed. May repeat in after Flagyl is complete, Disp: 2 tablet, Rfl: 0   fluticasone (FLONASE) 50 MCG/ACT nasal spray, Place 2 sprays into both nostrils daily., Disp: 16 g, Rfl: 0   ibuprofen (ADVIL) 800 MG tablet, Take 1 tablet (800 mg total) by mouth 3 (three)  times daily., Disp: 21 tablet, Rfl: 0   lidocaine (XYLOCAINE) 2 % solution, Use as directed 15 mLs in the mouth or throat as needed for mouth pain., Disp: 100 mL, Rfl: 1  Observations/Objective: Patient is well-developed, well-nourished in no acute distress.  Resting comfortably  at home.  Head is normocephalic, atraumatic.  No labored breathing.  Speech is clear and coherent with logical content.  Patient is alert and oriented at baseline.  Quarter size indurated erythematous lesion on left butt cheek.  Assessment and Plan:  Karen Salas in today with chief complaint of infection   1. Cellulitis and abscess of buttock Warm soaks Do not pick at area.  If not improving NTBS to lance area  Meds ordered this encounter  Medications   sulfamethoxazole-trimethoprim (BACTRIM DS) 800-160 MG tablet    Sig: Take 1 tablet by mouth 2 (two) times daily.    Dispense:  20 tablet    Refill:  0    Order Specific Question:   Supervising Provider    Answer:   Merrilee Jansky [[id number]]   fluconazole (DIFLUCAN) 150 MG tablet    Sig: Take 1 tablet (150 mg total) by mouth once for 1 dose.    Dispense:  1 tablet    Refill:  0    Order Specific Question:   Supervising Provider    Answer:   Merrilee Jansky [id number]        Follow Up Instructions: I discussed the assessment and treatment plan with the patient. The patient was provided an opportunity to ask questions and all were answered. The patient agreed with the plan and demonstrated an understanding of the instructions.  A copy of instructions were sent to the patient via MyChart.  The patient was advised to call back or seek an in-person evaluation if the symptoms worsen or if the condition fails to improve as anticipated.  Time:  I spent 15 minutes with the patient via telehealth technology discussing the above problems/concerns.    Mary-Margaret Daphine Deutscher, FNP
